# Patient Record
Sex: Female | Born: 2000 | Race: White | Hispanic: No | Marital: Single | Smoking: Never smoker
Health system: Southern US, Community
[De-identification: ages and names within clinical notes are randomized; demographics above are authoritative.]

## PROBLEM LIST (undated history)

## (undated) DIAGNOSIS — K219 Gastro-esophageal reflux disease without esophagitis: Secondary | ICD-10-CM

## (undated) DIAGNOSIS — F809 Developmental disorder of speech and language, unspecified: Secondary | ICD-10-CM

## (undated) DIAGNOSIS — G43909 Migraine, unspecified, not intractable, without status migrainosus: Secondary | ICD-10-CM

## (undated) HISTORY — DX: Migraine, unspecified, not intractable, without status migrainosus: G43.909

## (undated) HISTORY — DX: Developmental disorder of speech and language, unspecified: F80.9

## (undated) HISTORY — DX: Gastro-esophageal reflux disease without esophagitis: K21.9

---
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
## 2021-01-21 ENCOUNTER — Other Ambulatory Visit: Payer: Self-pay

## 2021-01-21 ENCOUNTER — Encounter: Payer: Self-pay | Admitting: Emergency Medicine

## 2021-01-21 ENCOUNTER — Ambulatory Visit
Admission: EM | Admit: 2021-01-21 | Discharge: 2021-01-21 | Disposition: A | Discharge status: Home / Self Care | Payer: Medicaid Other | Attending: Family Medicine | Admitting: Family Medicine

## 2021-01-21 DIAGNOSIS — J209 Acute bronchitis, unspecified: Secondary | ICD-10-CM

## 2021-01-21 DIAGNOSIS — J0140 Acute pansinusitis, unspecified: Secondary | ICD-10-CM

## 2021-01-21 MED ORDER — AMOXICILLIN-POT CLAVULANATE 875-125 MG PO TABS: 1.0000 | ORAL_TABLET | Freq: Two times a day (BID) | ORAL | 0 refills | Status: AC

## 2021-01-21 MED ORDER — BENZONATATE 100 MG PO CAPS: 100.0000 mg | ORAL_CAPSULE | Freq: Three times a day (TID) | ORAL | 0 refills | Status: DC

## 2021-01-21 MED ORDER — PREDNISONE 10 MG (21) PO TBPK: ORAL_TABLET | Freq: Every day | ORAL | 0 refills | Status: AC

## 2021-01-21 NOTE — ED Triage Notes (Signed)
Pt said x 1 week has been having congestion and cough. Pt said hurts to cough in her chest.Pt said has hx of bronchitis and severe allergies.

## 2021-01-21 NOTE — Discharge Instructions (Signed)
I have sent in Augmentin for you to take twice a day for 7 days.  I have sent in tessalon perles for you to use one capsule every 8 hours as needed for cough.  I have sent in a prednisone taper for you to take for 6 days. 6 tablets on day one, 5 tablets on day two, 4 tablets on day three, 3 tablets on day four, 2 tablets on day five, and 1 tablet on day six.  Follow up with this office or with primary care if symptoms are persisting.  Follow up in the ER for high fever, trouble swallowing, trouble breathing, other concerning symptoms.

## 2021-01-25 NOTE — ED Provider Notes (Signed)
[facility]    CSN: [id number] Arrival date & time: 01/21/21  1447      History   Chief Complaint Chief Complaint  Patient presents with  . Cough  . Nasal Congestion    HPI Ruth Snyder is a 20 y.o. female.   Reports nasal congestion and cough for the last 7 days.  States that it hurts to cough at this point.  States that she also has history of bronchitis and severe allergies.  Has has taken OTC cough and cold at home with no relief.  Denies sick contacts.  Has negative history of COVID.  Has completed COVID vaccines.  Has not completed flu vaccine.  Denies headache, shortness of breath, ear pain, nausea, vomiting, diarrhea, rash, fever, other symptoms.  ROS per HPI  The history is provided by the patient.  Cough   Past Medical History:  Diagnosis Date  . GERD (gastroesophageal reflux disease)   . Migraine   . Speech delay     Patient Active Problem List   Diagnosis Date Noted  . Migraine   . Viral syndrome 02/23/2013  . Dermatitis 02/23/2013  . Speech delay     History reviewed. No pertinent surgical history.  OB History   No obstetric history on file.      Home Medications    Prior to Admission medications   Medication Sig Start Date End Date Taking? Authorizing Provider  amoxicillin-clavulanate (AUGMENTIN) 875-125 MG tablet Take 1 tablet by mouth 2 (two) times daily for 7 days. 01/21/21 01/28/21 Yes Moshe Cipro, NP  benzonatate (TESSALON) 100 MG capsule Take 1 capsule (100 mg total) by mouth every 8 (eight) hours. 01/21/21  Yes Moshe Cipro, NP  predniSONE (STERAPRED UNI-PAK 21 TAB) 10 MG (21) TBPK tablet Take by mouth daily for 6 days. Take 6 tablets on day 1, 5 tablets on day 2, 4 tablets on day 3, 3 tablets on day 4, 2 tablets on day 5, 1 tablet on day 6 01/21/21 01/27/21 Yes Moshe Cipro, NP  escitalopram (LEXAPRO) 10 MG tablet TAKE ONE TABLET BY MOUTH ONCE DAILY. 10/03/20   Donita Brooks, MD  escitalopram  (LEXAPRO) 20 MG tablet TAKE ONE TABLET BY MOUTH ONCE DAILY. 02/06/20   Donita Brooks, MD  fluticasone (FLONASE) 50 MCG/ACT nasal spray Place 2 sprays into both nostrils daily. 11/12/20   Bing Neighbors, FNP  levocetirizine (XYZAL) 5 MG tablet Take 1 tablet (5 mg total) by mouth every evening. 11/12/20   Bing Neighbors, FNP  ondansetron (ZOFRAN ODT) 4 MG disintegrating tablet Take 1 tablet (4 mg total) by mouth every 8 (eight) hours as needed for nausea or vomiting. 05/15/20   Lamptey, Britta Mccreedy, MD  pantoprazole (PROTONIX) 20 MG tablet Take 1 tablet (20 mg total) by mouth daily. 05/15/20   Lamptey, Britta Mccreedy, MD    Family History History reviewed. No pertinent family history.  Social History Social History   Tobacco Use  . Smoking status: Passive Smoke Exposure - Never Smoker  . Smokeless tobacco: Never Used  Substance Use Topics  . Alcohol use: No    Alcohol/week: 0.0 standard drinks  . Drug use: No     Allergies   Patient has no known allergies.   Review of Systems Review of Systems  Respiratory: Positive for cough.      Physical Exam Triage Vital Signs ED Triage Vitals [01/21/21 1622]  Enc Vitals Group     BP 121/81     Pulse  Rate 100     Resp 16     Temp 98.2 F (36.8 C)     Temp Source Oral     SpO2 98 %     Weight      Height      Head Circumference      Peak Flow      Pain Score 4     Pain Loc      Pain Edu?      Excl. in GC?    No data found.  Updated Vital Signs BP 121/81 (BP Location: Right Arm)   Pulse 100   Temp 98.2 F (36.8 C) (Oral)   Resp 16   SpO2 98%   Visual Acuity Right Eye Distance:   Left Eye Distance:   Bilateral Distance:    Right Eye Near:   Left Eye Near:    Bilateral Near:     Physical Exam Vitals and nursing note reviewed.  Constitutional:      General: She is not in acute distress.    Appearance: Normal appearance. She is well-developed and normal weight.  HENT:     Head: Normocephalic and atraumatic.      Right Ear: Tympanic membrane, ear canal and external ear normal.     Left Ear: Tympanic membrane, ear canal and external ear normal.     Nose: Congestion present.     Mouth/Throat:     Mouth: Mucous membranes are moist.     Pharynx: Posterior oropharyngeal erythema present.  Eyes:     Extraocular Movements: Extraocular movements intact.     Conjunctiva/sclera: Conjunctivae normal.     Pupils: Pupils are equal, round, and reactive to light.  Cardiovascular:     Rate and Rhythm: Normal rate and regular rhythm.     Heart sounds: Normal heart sounds. No murmur heard.   Pulmonary:     Effort: Pulmonary effort is normal. No respiratory distress.     Breath sounds: No stridor. Wheezing present. No rhonchi or rales.  Chest:     Chest wall: No tenderness.  Musculoskeletal:        General: Normal range of motion.     Cervical back: Normal range of motion and neck supple.  Lymphadenopathy:     Cervical: Cervical adenopathy present.  Skin:    General: Skin is warm and dry.     Capillary Refill: Capillary refill takes less than 2 seconds.  Neurological:     General: No focal deficit present.     Mental Status: She is alert and oriented to person, place, and time.  Psychiatric:        Mood and Affect: Mood normal.        Behavior: Behavior normal.        Thought Content: Thought content normal.      UC Treatments / Results  Labs (all labs ordered are listed, but only abnormal results are displayed) Labs Reviewed - No data to display  EKG   Radiology No results found.  Procedures Procedures (including critical care time)  Medications Ordered in UC Medications - No data to display  Initial Impression / Assessment and Plan / UC Course  I have reviewed the triage vital signs and the nursing notes.  Pertinent labs & imaging results that were available during my care of the patient were reviewed by me and considered in my medical decision making (see chart for details).     Acute pansinusitis Acute bronchitis  Prescribed Augmentin 875 twice daily  x7 days Prescribed Tessalon Perles as needed for cough Prescribe steroid taper COVID and flu testing Follow up with this office or with primary care if symptoms are persisting.  Follow up in the ER for high fever, trouble swallowing, trouble breathing, other concerning symptoms.    Final Clinical Impressions(s) / UC Diagnoses   Final diagnoses:  Acute bronchitis, unspecified organism  Acute non-recurrent pansinusitis     Discharge Instructions     I have sent in Augmentin for you to take twice a day for 7 days.  I have sent in tessalon perles for you to use one capsule every 8 hours as needed for cough.  I have sent in a prednisone taper for you to take for 6 days. 6 tablets on day one, 5 tablets on day two, 4 tablets on day three, 3 tablets on day four, 2 tablets on day five, and 1 tablet on day six.  Follow up with this office or with primary care if symptoms are persisting.  Follow up in the ER for high fever, trouble swallowing, trouble breathing, other concerning symptoms.     ED Prescriptions    Medication Sig Dispense Auth. Provider   amoxicillin-clavulanate (AUGMENTIN) 875-125 MG tablet Take 1 tablet by mouth 2 (two) times daily for 7 days. 14 tablet Moshe Cipro, NP   predniSONE (STERAPRED UNI-PAK 21 TAB) 10 MG (21) TBPK tablet Take by mouth daily for 6 days. Take 6 tablets on day 1, 5 tablets on day 2, 4 tablets on day 3, 3 tablets on day 4, 2 tablets on day 5, 1 tablet on day 6 21 tablet Moshe Cipro, NP   benzonatate (TESSALON) 100 MG capsule Take 1 capsule (100 mg total) by mouth every 8 (eight) hours. 21 capsule Moshe Cipro, NP     PDMP not reviewed this encounter.   Moshe Cipro, NP 01/25/21 1013

## 2021-03-31 DIAGNOSIS — H5213 Myopia, bilateral: Secondary | ICD-10-CM | POA: Diagnosis not present

## 2021-07-15 ENCOUNTER — Encounter: Payer: Self-pay | Admitting: Family Medicine

## 2021-07-15 ENCOUNTER — Ambulatory Visit (INDEPENDENT_AMBULATORY_CARE_PROVIDER_SITE_OTHER): Payer: Medicaid Other | Admitting: Family Medicine

## 2021-07-15 ENCOUNTER — Other Ambulatory Visit: Payer: Self-pay

## 2021-07-15 VITALS — BP 122/78 | HR 16 | Temp 97.4°F | Resp 18 | Ht 62.0 in | Wt 178.0 lb

## 2021-07-15 DIAGNOSIS — J069 Acute upper respiratory infection, unspecified: Secondary | ICD-10-CM

## 2021-07-15 MED ORDER — OSELTAMIVIR PHOSPHATE 75 MG PO CAPS: 75.0000 mg | ORAL_CAPSULE | Freq: Two times a day (BID) | ORAL | 0 refills | Status: DC

## 2021-07-15 NOTE — Progress Notes (Signed)
Subjective:    Patient ID: Ruth Snyder, female    DOB: 2000/12/16, 20 y.o.   MRN: [id number]  HPI Patient states that everyone in her house is sick.  This includes her cousin who was recently diagnosed with the flu although the cousin has gone back to school today.  Symptoms for the patient began on Thursday.  Symptoms include fever, body aches, head congestion, rhinorrhea, and cough.  Today she is afebrile.  Her lungs are clear to auscultation bilaterally.  Her exam today is reassuring Past Medical History:  Diagnosis Date   GERD (gastroesophageal reflux disease)    Migraine    Speech delay    History reviewed. No pertinent surgical history. Current Outpatient Medications on File Prior to Visit  Medication Sig Dispense Refill   escitalopram (LEXAPRO) 20 MG tablet TAKE ONE TABLET BY MOUTH ONCE DAILY. (Patient not taking: Reported on 07/15/2021) 30 tablet 0   fluticasone (FLONASE) 50 MCG/ACT nasal spray Place 2 sprays into both nostrils daily. (Patient not taking: Reported on 07/15/2021) 16 g 0   levocetirizine (XYZAL) 5 MG tablet Take 1 tablet (5 mg total) by mouth every evening. (Patient not taking: Reported on 07/15/2021) 30 tablet 0   ondansetron (ZOFRAN ODT) 4 MG disintegrating tablet Take 1 tablet (4 mg total) by mouth every 8 (eight) hours as needed for nausea or vomiting. (Patient not taking: Reported on 07/15/2021) 10 tablet 0   pantoprazole (PROTONIX) 20 MG tablet Take 1 tablet (20 mg total) by mouth daily. (Patient not taking: Reported on 07/15/2021) 30 tablet 0   No current facility-administered medications on file prior to visit.   No Known Allergies  Social History   Socioeconomic History   Marital status: Single    Spouse name: Not on file   Number of children: Not on file   Years of education: Not on file   Highest education level: Not on file  Occupational History   Not on file  Tobacco Use   Smoking status: Passive Smoke Exposure - Never Smoker    Smokeless tobacco: Never  Substance and Sexual Activity   Alcohol use: No    Alcohol/week: 0.0 standard drinks   Drug use: No   Sexual activity: Not on file  Other Topics Concern   Not on file  Social History Narrative   ** Merged History Encounter **       Social Determinants of Health   Financial Resource Strain: Not on file  Food Insecurity: Not on file  Transportation Needs: Not on file  Physical Activity: Not on file  Stress: Not on file  Social Connections: Not on file  Intimate Partner Violence: Not on file      Review of Systems  All other systems reviewed and are negative.     Objective:   Physical Exam Constitutional:      Appearance: Normal appearance. She is not ill-appearing or toxic-appearing.  HENT:     Right Ear: Tympanic membrane and ear canal normal.     Left Ear: Tympanic membrane and ear canal normal.     Nose: Congestion and rhinorrhea present.     Mouth/Throat:     Pharynx: No oropharyngeal exudate or posterior oropharyngeal erythema.  Eyes:     Conjunctiva/sclera: Conjunctivae normal.  Cardiovascular:     Rate and Rhythm: Normal rate and regular rhythm.     Heart sounds: Normal heart sounds. No murmur heard. Pulmonary:     Effort: Pulmonary effort is normal. No respiratory distress.  Breath sounds: Normal breath sounds. No wheezing, rhonchi or rales.  Musculoskeletal:     Cervical back: Neck supple.  Lymphadenopathy:     Cervical: No cervical adenopathy.  Neurological:     Mental Status: She is alert.          Assessment & Plan:  Viral URI - Plan: Influenza a and b Given the symptoms, the patient sounds like she has a viral upper respiratory infection.  Given the exposure to flu I will screen her for the flu.  Unfortunately we are out of rapid flu test.  I will also screen the patient for COVID.  Test will be back in 48 hours so I will cover the patient with Tamiflu empirically given her exposure

## 2021-07-16 ENCOUNTER — Telehealth: Payer: Self-pay | Admitting: *Deleted

## 2021-07-16 LAB — SARS-COVID-2 RNA(COVID19)AND INFLUENZA A&B, QUALITATIVE NAAT
FLU A: NOT DETECTED
FLU B: NOT DETECTED
SARS CoV2 RNA: NOT DETECTED

## 2021-07-16 NOTE — Telephone Encounter (Signed)
Received call from patient grandmother.   Reports that patient was seen on 07/15/2021 for viral URI. States that she did not feel well enough to go to work last night.    Requested excuse for 07/15/2021.  Patient works at [employer], [employer].  Note transcribed.

## 2021-11-04 ENCOUNTER — Ambulatory Visit
Admission: EM | Admit: 2021-11-04 | Discharge: 2021-11-04 | Disposition: A | Discharge status: Home / Self Care | Payer: Medicaid Other | Attending: Family Medicine | Admitting: Family Medicine

## 2021-11-04 ENCOUNTER — Other Ambulatory Visit: Payer: Self-pay

## 2021-11-04 DIAGNOSIS — L02419 Cutaneous abscess of limb, unspecified: Secondary | ICD-10-CM

## 2021-11-04 MED ORDER — CEPHALEXIN 500 MG PO CAPS: 500.0000 mg | ORAL_CAPSULE | Freq: Two times a day (BID) | ORAL | 0 refills | Status: DC

## 2021-11-04 MED ORDER — CHLORHEXIDINE GLUCONATE 4 % EX LIQD: Freq: Every day | CUTANEOUS | 0 refills | Status: DC | PRN

## 2021-11-04 NOTE — ED Provider Notes (Signed)
?[facility] ? ? ? ?CSN: [id number] ?Arrival date & time: 11/04/21  1215 ? ? ?  ? ?History   ?Chief Complaint ?Chief Complaint  ?Patient presents with  ? Abscess  ? ? ?HPI ?Ruth Snyder is a 21 y.o. female.  ? ?Presenting today with a red painful bump under her right underarm that she states has been present for months but larger, more red and swollen the past few days.  She has tried warm compresses with no relief of symptoms.  Denies fever, drainage, bleeding, injury to the area. ? ? ?Past Medical History:  ?Diagnosis Date  ? GERD (gastroesophageal reflux disease)   ? Migraine   ? Speech delay   ? ? ?Patient Active Problem List  ? Diagnosis Date Noted  ? Migraine   ? Viral syndrome 02/23/2013  ? Dermatitis 02/23/2013  ? Speech delay   ? ? ?History reviewed. No pertinent surgical history. ? ?OB History   ?No obstetric history on file. ?  ? ? ? ?Home Medications   ? ?Prior to Admission medications   ?Medication Sig Start Date End Date Taking? Authorizing Provider  ?cephALEXin (KEFLEX) 500 MG capsule Take 1 capsule (500 mg total) by mouth 2 (two) times daily. 11/04/21  Yes Particia Nearing, PA-C  ?chlorhexidine (HIBICLENS) 4 % external liquid Apply topically daily as needed. 11/04/21  Yes Particia Nearing, PA-C  ?escitalopram (LEXAPRO) 20 MG tablet TAKE ONE TABLET BY MOUTH ONCE DAILY. ?Patient not taking: Reported on 07/15/2021 02/06/20   Donita Brooks, MD  ?fluticasone [facility]) 50 MCG/ACT nasal spray Place 2 sprays into both nostrils daily. ?Patient not taking: Reported on 07/15/2021 11/12/20   Bing Neighbors, FNP  ?levocetirizine (XYZAL) 5 MG tablet Take 1 tablet (5 mg total) by mouth every evening. ?Patient not taking: Reported on 07/15/2021 11/12/20   Bing Neighbors, FNP  ?ondansetron (ZOFRAN ODT) 4 MG disintegrating tablet Take 1 tablet (4 mg total) by mouth every 8 (eight) hours as needed for nausea or vomiting. ?Patient not taking: Reported on 07/15/2021 05/15/20   Merrilee Jansky, MD  ?oseltamivir (TAMIFLU) 75 MG capsule Take 1 capsule (75 mg total) by mouth 2 (two) times daily. 07/15/21   Donita Brooks, MD  ?pantoprazole (PROTONIX) 20 MG tablet Take 1 tablet (20 mg total) by mouth daily. ?Patient not taking: Reported on 07/15/2021 05/15/20   Merrilee Jansky, MD  ? ? ?Family History ?History reviewed. No pertinent family history. ? ?Social History ?Social History  ? ?Tobacco Use  ? Smoking status: Passive Smoke Exposure - Never Smoker  ? Smokeless tobacco: Never  ?Substance Use Topics  ? Alcohol use: No  ?  Alcohol/week: 0.0 standard drinks  ? Drug use: No  ? ? ? ?Allergies   ?Patient has no known allergies. ? ? ?Review of Systems ?Review of Systems ?Per HPI ? ?Physical Exam ?Triage Vital Signs ?ED Triage Vitals  ?Enc Vitals Group  ?   BP 11/04/21 1414 133/81  ?   Pulse Rate 11/04/21 1414 85  ?   Resp 11/04/21 1414 18  ?   Temp 11/04/21 1414 98.3 ?F (36.8 ?C)  ?   Temp src --   ?   SpO2 11/04/21 1414 99 %  ?   Weight --   ?   Height --   ?   Head Circumference --   ?   Peak Flow --   ?   Pain Score 11/04/21 1412 5  ?  Pain Loc --   ?   Pain Edu? --   ?   Excl. in GC? --   ? ?No data found. ? ?Updated Vital Signs ?BP 133/81   Pulse 85   Temp 98.3 ?F (36.8 ?C)   Resp 18   SpO2 99%  ? ?Visual Acuity ?Right Eye Distance:   ?Left Eye Distance:   ?Bilateral Distance:   ? ?Right Eye Near:   ?Left Eye Near:    ?Bilateral Near:    ? ?Physical Exam ?Vitals and nursing note reviewed.  ?Constitutional:   ?   Appearance: Normal appearance. She is not ill-appearing.  ?HENT:  ?   Head: Atraumatic.  ?Eyes:  ?   Extraocular Movements: Extraocular movements intact.  ?   Conjunctiva/sclera: Conjunctivae normal.  ?Cardiovascular:  ?   Rate and Rhythm: Normal rate and regular rhythm.  ?   Heart sounds: Normal heart sounds.  ?Pulmonary:  ?   Effort: Pulmonary effort is normal.  ?   Breath sounds: Normal breath sounds.  ?Musculoskeletal:     ?   General: Normal range of motion.  ?   Cervical back:  Normal range of motion and neck supple.  ?Skin: ?   General: Skin is warm and dry.  ?   Comments: Erythematous Semi fluctuant tender to palpation mass right axilla.  No induration, drainage, bleeding.  Sub-1 cm in diameter  ?Neurological:  ?   Mental Status: She is alert and oriented to person, place, and time.  ?Psychiatric:     ?   Mood and Affect: Mood normal.     ?   Thought Content: Thought content normal.     ?   Judgment: Judgment normal.  ? ? ? ?UC Treatments / Results  ?Labs ?(all labs ordered are listed, but only abnormal results are displayed) ?Labs Reviewed - No data to display ? ?EKG ? ? ?Radiology ?No results found. ? ?Procedures ?Procedures (including critical care time) ? ?Medications Ordered in UC ?Medications - No data to display ? ?Initial Impression / Assessment and Plan / UC Course  ?I have reviewed the triage vital signs and the nursing notes. ? ?Pertinent labs & imaging results that were available during my care of the patient were reviewed by me and considered in my medical decision making (see chart for details). ? ?  ? ?Early infection of chronic cyst.  Treat with Keflex, Hibiclens, warm compresses and follow-up with PCP for recheck. ? ?Final Clinical Impressions(s) / UC Diagnoses  ? ?Final diagnoses:  ?Axillary abscess  ? ?Discharge Instructions   ?None ?  ? ?ED Prescriptions   ? ? Medication Sig Dispense Auth. Provider  ? cephALEXin (KEFLEX) 500 MG capsule Take 1 capsule (500 mg total) by mouth 2 (two) times daily. 14 capsule Particia Nearing, [place]  ? chlorhexidine (HIBICLENS) 4 % external liquid Apply topically daily as needed. 120 mL Particia Nearing, [place]  ? ?  ? ?PDMP not reviewed this encounter. ?  ?Particia Nearing, PA-C ?11/04/21 1448 ? ?

## 2021-11-04 NOTE — ED Triage Notes (Signed)
Pt presents with possible abscess under right arm. Small red bump that she states that its been there for a while  ?

## 2021-11-19 ENCOUNTER — Other Ambulatory Visit: Payer: Self-pay

## 2021-11-19 ENCOUNTER — Ambulatory Visit
Admission: EM | Admit: 2021-11-19 | Discharge: 2021-11-19 | Disposition: A | Discharge status: Home / Self Care | Payer: Medicaid Other | Attending: Family Medicine | Admitting: Family Medicine

## 2021-11-19 DIAGNOSIS — J029 Acute pharyngitis, unspecified: Secondary | ICD-10-CM | POA: Insufficient documentation

## 2021-11-19 DIAGNOSIS — H1033 Unspecified acute conjunctivitis, bilateral: Secondary | ICD-10-CM | POA: Insufficient documentation

## 2021-11-19 LAB — POCT RAPID STREP A (OFFICE): Rapid Strep A Screen: NEGATIVE

## 2021-11-19 MED ORDER — TOBRAMYCIN 0.3 % OP SOLN: 1.0000 [drp] | Freq: Four times a day (QID) | OPHTHALMIC | 0 refills | Status: DC

## 2021-11-19 NOTE — Discharge Instructions (Signed)
You may use over the counter ibuprofen or acetaminophen as needed.  For a sore throat, over the counter products such as [employer] Peroxyl Mouth Sore Rinse or Chloraseptic Sore Throat Spray may provide some temporary relief. Your rapid strep test was negative today. We have sent your throat swab for culture and will let you know of any positive results. 

## 2021-11-19 NOTE — ED Triage Notes (Signed)
Pt states  her throat has been hurting for a few days ? ?Pt states her throat hurts worse at night.l She states she has tried some hot tea and honey ? ?Pt states her left eye is running and her throat is sore as well  ? ?Pt states she use a hot compress on her left eye this morning and mucus came out ? ?Denies Fever ?

## 2021-11-20 NOTE — ED Provider Notes (Signed)
?  [facility] ? ? ?[id number] ?11/19/21 Arrival Time: [id number] ? ?ASSESSMENT & PLAN: ? ?1. Sore throat   ?2. Acute conjunctivitis of both eyes, unspecified acute conjunctivitis type   ? ?Discussed typical duration of viral illnesses. ?Rapid strep negative. Throat culture sent. ?OTC symptom care as needed. ? ?Discharge Medication List as of 11/19/2021 10:59 AM  ?  ? ?START taking these medications  ? Details  ?tobramycin (TOBREX) 0.3 % ophthalmic solution Place 1 drop into both eyes every 6 (six) hours. Use for 5-7 days., Starting Tue 11/19/2021, Normal  ?  ?  ? ? ? Follow-up Information   ? ? Donita Brooks, MD.   ?Specialty: Family Medicine ?Why: If worsening or failing to improve as anticipated. ?Contact information: ?[address] [place] [address] ?[phone] ? ? ?  ?  ? ?  ?  ? ?  ? ? ?Reviewed expectations re: course of current medical issues. Questions answered. ?Outlined signs and symptoms indicating need for more acute intervention. ?Understanding verbalized. ?After Visit Summary given. ? ? ?SUBJECTIVE: ?History from: Patient. ?Ruth Snyder is a 21 y.o. female. Reports: ST; few days; worse at night. Now L eye is irritated with watery drainage. Denies: fever, cough, and difficulty breathing. Normal PO intake without n/v/d. ? ?OBJECTIVE: ? ?Vitals:  ? 11/19/21 1032  ?BP: (!) 145/74  ?Pulse: 91  ?Resp: 20  ?Temp: 97.9 ?F (36.6 ?C)  ?TempSrc: Oral  ?SpO2: 98%  ?  ?General appearance: alert; no distress ?Eyes: PERRLA; EOMI; conjunctiva OS with mild irritation and watery drainage ?HENT: [place]; AT; with mild nasal congestion ?Neck: supple  ?Lungs: speaks full sentences without difficulty; unlabored ?Extremities: no edema ?Skin: warm and dry ?Neurologic: normal gait ?Psychological: alert and cooperative; normal mood and affect ? ?Labs: ?Results for orders placed or performed during the hospital encounter of 11/19/21  ?POCT rapid strep A  ?Result Value Ref Range  ? Rapid Strep A Screen Negative  Negative  ? ?Labs Reviewed  ?CULTURE, GROUP A STREP [facility])  ?POCT RAPID STREP A (OFFICE)  ? ? ? ?No Known Allergies ? ?Past Medical History:  ?Diagnosis Date  ? GERD (gastroesophageal reflux disease)   ? Migraine   ? Speech delay   ? ?Social History  ? ?Socioeconomic History  ? Marital status: Single  ?  Spouse name: Not on file  ? Number of children: Not on file  ? Years of education: Not on file  ? Highest education level: Not on file  ?Occupational History  ? Not on file  ?Tobacco Use  ? Smoking status: Never  ?  Passive exposure: Yes  ? Smokeless tobacco: Never  ?Vaping Use  ? Vaping Use: Never used  ?Substance and Sexual Activity  ? Alcohol use: No  ?  Alcohol/week: 0.0 standard drinks  ? Drug use: No  ? Sexual activity: Never  ?  Birth control/protection: None  ?Other Topics Concern  ? Not on file  ?Social History Narrative  ? ** Merged History Encounter **  ?    ? ?Social Determinants of Health  ? ?Financial Resource Strain: Not on file  ?Food Insecurity: Not on file  ?Transportation Needs: Not on file  ?Physical Activity: Not on file  ?Stress: Not on file  ?Social Connections: Not on file  ?Intimate Partner Violence: Not on file  ? ?No family history on file. ?History reviewed. No pertinent surgical history. ?  Mardella Layman, MD ?11/20/21 [phone] ? ?

## 2021-11-22 LAB — CULTURE, GROUP A STREP (THRC)

## 2021-12-19 ENCOUNTER — Encounter: Payer: Self-pay | Admitting: Emergency Medicine

## 2021-12-19 ENCOUNTER — Ambulatory Visit (INDEPENDENT_AMBULATORY_CARE_PROVIDER_SITE_OTHER): Payer: Medicaid Other

## 2021-12-19 ENCOUNTER — Ambulatory Visit
Admission: EM | Admit: 2021-12-19 | Discharge: 2021-12-19 | Disposition: A | Discharge status: Home / Self Care | Payer: Medicaid Other | Attending: Urgent Care | Admitting: Urgent Care

## 2021-12-19 DIAGNOSIS — M25562 Pain in left knee: Secondary | ICD-10-CM

## 2021-12-19 DIAGNOSIS — S8392XA Sprain of unspecified site of left knee, initial encounter: Secondary | ICD-10-CM

## 2021-12-19 DIAGNOSIS — M25561 Pain in right knee: Secondary | ICD-10-CM

## 2021-12-19 MED ORDER — NAPROXEN 500 MG PO TABS: 500.0000 mg | ORAL_TABLET | Freq: Two times a day (BID) | ORAL | 0 refills | Status: DC

## 2021-12-19 NOTE — ED Provider Notes (Signed)
?[facility] ? ? ?MRN: [id number] DOB: 21-Dec-2000 ? ?Subjective:  ? ?Ruth Snyder is a 21 y.o. female presenting for 2-day history of acute onset left knee pain with swelling.  Patient was at work when she suffered the injury.  States that she was standing and ended up twisting her knee awkwardly as she turned.  This caused pain and when she stood upright felt like her knee popped in 3 places.  She has since had difficulty bearing weight, walking.  Has not used medications for pain relief.  Feels like the knee is numb and tingling as well. ? ?No current facility-administered medications for this encounter. ? ?Current Outpatient Medications:  ?  escitalopram (LEXAPRO) 20 MG tablet, TAKE ONE TABLET BY MOUTH ONCE DAILY. (Patient not taking: Reported on 07/15/2021), Disp: 30 tablet, Rfl: 0 ?  fluticasone (FLONASE) 50 MCG/ACT nasal spray, Place 2 sprays into both nostrils daily. (Patient not taking: Reported on 07/15/2021), Disp: 16 g, Rfl: 0 ?  levocetirizine (XYZAL) 5 MG tablet, Take 1 tablet (5 mg total) by mouth every evening. (Patient not taking: Reported on 07/15/2021), Disp: 30 tablet, Rfl: 0 ?  ondansetron (ZOFRAN ODT) 4 MG disintegrating tablet, Take 1 tablet (4 mg total) by mouth every 8 (eight) hours as needed for nausea or vomiting. (Patient not taking: Reported on 07/15/2021), Disp: 10 tablet, Rfl: 0 ?  oseltamivir (TAMIFLU) 75 MG capsule, Take 1 capsule (75 mg total) by mouth 2 (two) times daily., Disp: 10 capsule, Rfl: 0 ?  pantoprazole (PROTONIX) 20 MG tablet, Take 1 tablet (20 mg total) by mouth daily. (Patient not taking: Reported on 07/15/2021), Disp: 30 tablet, Rfl: 0 ?  tobramycin (TOBREX) 0.3 % ophthalmic solution, Place 1 drop into both eyes every 6 (six) hours. Use for 5-7 days., Disp: 5 mL, Rfl: 0  ? ?No Known Allergies ? ?Past Medical History:  ?Diagnosis Date  ? GERD (gastroesophageal reflux disease)   ? Migraine   ? Speech delay   ?  ? ?History reviewed. No pertinent  surgical history. ? ?History reviewed. No pertinent family history. ? ?Social History  ? ?Tobacco Use  ? Smoking status: Never  ?  Passive exposure: Yes  ? Smokeless tobacco: Never  ?Vaping Use  ? Vaping Use: Never used  ?Substance Use Topics  ? Alcohol use: No  ?  Alcohol/week: 0.0 standard drinks  ? Drug use: No  ? ? ?ROS ? ? ?Objective:  ? ?Vitals: ?BP 135/84 (BP Location: Right Arm)   Pulse 85   Temp 98.4 ?F (36.9 ?C) (Oral)   Resp 18   LMP 11/17/2021 (Exact Date)   SpO2 99%  ? ?Physical Exam ?Constitutional:   ?   General: She is not in acute distress. ?   Appearance: Normal appearance. She is well-developed. She is not ill-appearing, toxic-appearing or diaphoretic.  ?HENT:  ?   Head: Normocephalic and atraumatic.  ?   Nose: Nose normal.  ?   Mouth/Throat:  ?   Mouth: Mucous membranes are moist.  ?Eyes:  ?   General: No scleral icterus.    ?   Right eye: No discharge.     ?   Left eye: No discharge.  ?   Extraocular Movements: Extraocular movements intact.  ?Cardiovascular:  ?   Rate and Rhythm: Normal rate.  ?Pulmonary:  ?   Effort: Pulmonary effort is normal.  ?Musculoskeletal:  ?   Left knee: Bony tenderness present. No swelling, deformity, effusion, erythema, ecchymosis, lacerations or crepitus. Decreased  range of motion. Tenderness present over the medial joint line, lateral joint line and patellar tendon. Normal alignment and normal patellar mobility.  ?   Comments: Significant guarding of the left knee.  ?Skin: ?   General: Skin is warm and dry.  ?Neurological:  ?   General: No focal deficit present.  ?   Mental Status: She is alert and oriented to person, place, and time.  ?Psychiatric:     ?   Mood and Affect: Mood normal.     ?   Behavior: Behavior normal.  ? ? ?DG Knee Complete 4 Views Left ? ?Result Date: 12/19/2021 ?CLINICAL DATA:  Twisted left knee and felt a pop 3 times. Injury was 3 days ago. EXAM: LEFT KNEE - COMPLETE 4+ VIEW COMPARISON:  None. FINDINGS: Normal bone mineralization. Joint  spaces are preserved. No joint effusion. No acute fracture or dislocation. IMPRESSION: No acute abnormality. Electronically Signed   By: Neita Garnet M.D.   On: 12/19/2021 10:25   ? ?A 4 inch Ace wrap was applied to the left knee. ? ?Assessment and Plan :  ? ?PDMP not reviewed this encounter. ? ?1. Sprain of left knee, unspecified ligament, initial encounter   ?2. Acute pain of right knee   ? ? Will manage for knee sprain with rice method, NSAID.  Patient has an orthopedist in [place], [place] that she follows up with.  Recommended contacting their office today.  Counseled patient on potential for adverse effects with medications prescribed/recommended today, ER and return-to-clinic precautions discussed, patient verbalized understanding. ? ?  ?Wallis Bamberg, PA-C ?12/19/21 1127 ? ?

## 2021-12-19 NOTE — ED Triage Notes (Signed)
Left knee pain, fell 2 days ago and twisted knee.  States knee popped 3 times.  States leg fells numb and tingly  ?

## 2021-12-23 ENCOUNTER — Encounter: Payer: Self-pay | Admitting: Family Medicine

## 2021-12-23 ENCOUNTER — Ambulatory Visit (INDEPENDENT_AMBULATORY_CARE_PROVIDER_SITE_OTHER): Payer: Medicaid Other | Admitting: Family Medicine

## 2021-12-23 VITALS — BP 120/90 | HR 93 | Temp 97.7°F | Ht 62.0 in | Wt 181.1 lb

## 2021-12-23 DIAGNOSIS — M25562 Pain in left knee: Secondary | ICD-10-CM

## 2021-12-23 MED ORDER — DICLOFENAC SODIUM 75 MG PO TBEC: 75.0000 mg | DELAYED_RELEASE_TABLET | Freq: Two times a day (BID) | ORAL | 0 refills | Status: DC

## 2021-12-23 NOTE — Progress Notes (Signed)
? ?Subjective:  ? ? Patient ID: Ruth Snyder, female    DOB: [birth year], 21 y.o.   MRN: [id number] ? ?HPI ?Patient states that she was at work last week when she fell and twisted her knee as she fell.  She reports severe pain in her knee over the medial and lateral joint line anteriorly.  There is no significant effusion today on exam.  There is no erythema or bruising.  There is no swelling.  However there is tenderness to palpation over the anterior medial and lateral joint lines.  Last week she went to an urgent care where they took an x-ray that was negative.  They gave her a knee brace due to concern about possible torn ligament.  On examination today there is no laxity to varus or valgus stress suggesting that the MCL and LCL were not injured.  She has negative anterior and posterior drawer sign.  She does have pain generally all over the knee with Apley grind but it does not localize to the posterior border of the knee to suggest a meniscal tear.  She states she is taking a medication that starts with a P for pain.  I am assuming this is Percocet.  She denies taking any NSAID. ?Past Medical History:  ?Diagnosis Date  ? GERD (gastroesophageal reflux disease)   ? Migraine   ? Speech delay   ? ?No past surgical history on file. ?No current outpatient medications on file prior to visit.  ? ?No current facility-administered medications on file prior to visit.  ? ?No Known Allergies ? ?Social History  ? ?Socioeconomic History  ? Marital status: Single  ?  Spouse name: Not on file  ? Number of children: Not on file  ? Years of education: Not on file  ? Highest education level: Not on file  ?Occupational History  ? Not on file  ?Tobacco Use  ? Smoking status: Never  ?  Passive exposure: Yes  ? Smokeless tobacco: Never  ?Vaping Use  ? Vaping Use: Never used  ?Substance and Sexual Activity  ? Alcohol use: No  ?  Alcohol/week: 0.0 standard drinks  ? Drug use: No  ? Sexual activity: Never  ?  Birth  control/protection: None  ?Other Topics Concern  ? Not on file  ?Social History Narrative  ? ** Merged History Encounter **  ?    ? ?Social Determinants of Health  ? ?Financial Resource Strain: Not on file  ?Food Insecurity: Not on file  ?Transportation Needs: Not on file  ?Physical Activity: Not on file  ?Stress: Not on file  ?Social Connections: Not on file  ?Intimate Partner Violence: Not on file  ? ? ? ? ?Review of Systems  ?All other systems reviewed and are negative. ? ?   ?Objective:  ? Physical Exam ?Constitutional:   ?   Appearance: Normal appearance. She is not ill-appearing or toxic-appearing.  ?Cardiovascular:  ?   Rate and Rhythm: Normal rate and regular rhythm.  ?   Heart sounds: Normal heart sounds. No murmur heard. ?Pulmonary:  ?   Effort: Pulmonary effort is normal. No respiratory distress.  ?   Breath sounds: Normal breath sounds. No wheezing, rhonchi or rales.  ?Musculoskeletal:  ?   Left knee: No swelling, deformity, effusion, erythema or ecchymosis. Decreased range of motion. Tenderness present over the medial joint line and lateral joint line. No LCL laxity, MCL laxity, ACL laxity or PCL laxity.Normal meniscus and normal patellar mobility.  ?Neurological:  ?  Mental Status: She is alert.  ? ? ? ? ? ?   ?Assessment & Plan:  ?Acute pain of left knee ?There is no significant instability today on the exam.  Therefore I do not feel that the patient has likely ruptured an MCL, LCL, ACL, or PCL.  I cannot rule out a meniscal tear given the diffuse nature of the pain.  Range of motion.  X-rays rule out fracture.  Patient has an appointment tomorrow to see an orthopedist.  The only thing I would add is to start an NSAID diclofenac 75 mg twice a day.  I would like the patient to call me back with her medicine first to make sure what she is taking.  If she is not on an NSAID I would recommend taking diclofenac as I believe that this would help with the pain and inflammation. ?

## 2021-12-24 DIAGNOSIS — S8992XA Unspecified injury of left lower leg, initial encounter: Secondary | ICD-10-CM | POA: Diagnosis not present

## 2021-12-24 DIAGNOSIS — S83422A Sprain of lateral collateral ligament of left knee, initial encounter: Secondary | ICD-10-CM | POA: Diagnosis not present

## 2021-12-24 DIAGNOSIS — M25562 Pain in left knee: Secondary | ICD-10-CM | POA: Diagnosis not present

## 2021-12-24 DIAGNOSIS — M25462 Effusion, left knee: Secondary | ICD-10-CM | POA: Diagnosis not present

## 2021-12-26 ENCOUNTER — Other Ambulatory Visit: Payer: Self-pay | Admitting: Family Medicine

## 2021-12-26 MED ORDER — MELOXICAM 15 MG PO TABS: 15.0000 mg | ORAL_TABLET | Freq: Every day | ORAL | 0 refills | Status: AC

## 2022-01-01 DIAGNOSIS — M25462 Effusion, left knee: Secondary | ICD-10-CM | POA: Diagnosis not present

## 2022-01-01 DIAGNOSIS — M25562 Pain in left knee: Secondary | ICD-10-CM | POA: Diagnosis not present

## 2022-01-01 DIAGNOSIS — S8992XD Unspecified injury of left lower leg, subsequent encounter: Secondary | ICD-10-CM | POA: Diagnosis not present

## 2022-01-06 DIAGNOSIS — M25462 Effusion, left knee: Secondary | ICD-10-CM | POA: Diagnosis not present

## 2022-01-06 DIAGNOSIS — S8992XD Unspecified injury of left lower leg, subsequent encounter: Secondary | ICD-10-CM | POA: Diagnosis not present

## 2022-01-06 DIAGNOSIS — M25562 Pain in left knee: Secondary | ICD-10-CM | POA: Diagnosis not present

## 2022-03-19 ENCOUNTER — Ambulatory Visit
Admission: EM | Admit: 2022-03-19 | Discharge: 2022-03-19 | Disposition: A | Discharge status: Home / Self Care | Payer: Medicaid Other | Attending: Family Medicine | Admitting: Family Medicine

## 2022-03-19 ENCOUNTER — Encounter: Payer: Self-pay | Admitting: Emergency Medicine

## 2022-03-19 DIAGNOSIS — R197 Diarrhea, unspecified: Secondary | ICD-10-CM

## 2022-03-19 DIAGNOSIS — R112 Nausea with vomiting, unspecified: Secondary | ICD-10-CM | POA: Diagnosis not present

## 2022-03-19 MED ORDER — ONDANSETRON 4 MG PO TBDP: 4.0000 mg | ORAL_TABLET | Freq: Three times a day (TID) | ORAL | 0 refills | Status: DC | PRN

## 2022-03-19 MED ORDER — ONDANSETRON 4 MG PO TBDP
4.0000 mg | ORAL_TABLET | Freq: Once | ORAL | Status: AC
  Administered 2022-03-19: 4 mg via ORAL

## 2022-03-19 NOTE — ED Triage Notes (Signed)
Headache, vomiting and diarrhea since Monday.

## 2022-03-19 NOTE — ED Provider Notes (Signed)
[facility]    CSN: [id number] Arrival date & time: 03/19/22  3785      History   Chief Complaint No chief complaint on file.   HPI Ruth Snyder is a 21 y.o. female.   Presenting today with 2-day history of nausea, vomiting, diarrhea, headache, fatigue.  Denies cough, congestion, chest pain, shortness of breath, abdominal pain, fever, rashes.  So far not trying anything over-the-counter for symptoms.  Tolerating small amounts of p.o. well.  Family member sick with similar symptoms.    Past Medical History:  Diagnosis Date   GERD (gastroesophageal reflux disease)    Migraine    Speech delay     Patient Active Problem List   Diagnosis Date Noted   Migraine    Viral syndrome 02/23/2013   Dermatitis 02/23/2013   Speech delay     History reviewed. No pertinent surgical history.  OB History   No obstetric history on file.      Home Medications    Prior to Admission medications   Medication Sig Start Date End Date Taking? Authorizing Provider  meloxicam (MOBIC) 15 MG tablet Take 1 tablet (15 mg total) by mouth daily. 12/26/21   Donita Brooks, MD  ondansetron (ZOFRAN-ODT) 4 MG disintegrating tablet Take 1 tablet (4 mg total) by mouth every 8 (eight) hours as needed for nausea or vomiting. 03/19/22  Yes Particia Nearing, PA-C    Family History History reviewed. No pertinent family history.  Social History Social History   Tobacco Use   Smoking status: Never    Passive exposure: Yes   Smokeless tobacco: Never  Vaping Use   Vaping Use: Never used  Substance Use Topics   Alcohol use: No    Alcohol/week: 0.0 standard drinks of alcohol   Drug use: No     Allergies   Patient has no known allergies.   Review of Systems Review of Systems Per HPI  Physical Exam Triage Vital Signs ED Triage Vitals  Enc Vitals Group     BP 03/19/22 1858 108/72     Pulse Rate 03/19/22 1858 78     Resp 03/19/22 1858 18     Temp 03/19/22 1858  98.2 F (36.8 C)     Temp Source 03/19/22 1858 Oral     SpO2 03/19/22 1858 96 %     Weight --      Height --      Head Circumference --      Peak Flow --      Pain Score 03/19/22 1859 5     Pain Loc --      Pain Edu? --      Excl. in GC? --    No data found.  Updated Vital Signs BP 108/72 (BP Location: Right Arm)   Pulse 78   Temp 98.2 F (36.8 C) (Oral)   Resp 18   LMP 02/20/2022 (Approximate)   SpO2 96%   Visual Acuity Right Eye Distance:   Left Eye Distance:   Bilateral Distance:    Right Eye Near:   Left Eye Near:    Bilateral Near:     Physical Exam Vitals and nursing note reviewed.  Constitutional:      Appearance: Normal appearance. She is not ill-appearing.  HENT:     Head: Atraumatic.     Nose: Nose normal.     Mouth/Throat:     Mouth: Mucous membranes are moist.  Eyes:     Extraocular Movements: Extraocular  movements intact.     Conjunctiva/sclera: Conjunctivae normal.  Cardiovascular:     Rate and Rhythm: Normal rate and regular rhythm.     Heart sounds: Normal heart sounds.  Pulmonary:     Effort: Pulmonary effort is normal.     Breath sounds: Normal breath sounds.  Abdominal:     General: Abdomen is flat. Bowel sounds are normal. There is no distension.     Palpations: Abdomen is soft.     Tenderness: There is no abdominal tenderness. There is no guarding.  Musculoskeletal:        General: Normal range of motion.     Cervical back: Normal range of motion and neck supple.  Skin:    General: Skin is warm and dry.  Neurological:     Mental Status: She is alert and oriented to person, place, and time.     Motor: No weakness.     Gait: Gait normal.  Psychiatric:        Mood and Affect: Mood normal.        Thought Content: Thought content normal.        Judgment: Judgment normal.      UC Treatments / Results  Labs (all labs ordered are listed, but only abnormal results are displayed) Labs Reviewed - No data to  display  EKG   Radiology No results found.  Procedures Procedures (including critical care time)  Medications Ordered in UC Medications  ondansetron (ZOFRAN-ODT) disintegrating tablet 4 mg (4 mg Oral Given 03/19/22 1925)    Initial Impression / Assessment and Plan / UC Course  I have reviewed the triage vital signs and the nursing notes.  Pertinent labs & imaging results that were available during my care of the patient were reviewed by me and considered in my medical decision making (see chart for details).     Zofran administered in clinic for active nausea, declines viral testing today.  Treat with Zofran, brat diet, fluids, rest.  Work note given.  Return for worsening symptoms.  Final Clinical Impressions(s) / UC Diagnoses   Final diagnoses:  Nausea vomiting and diarrhea   Discharge Instructions   None    ED Prescriptions     Medication Sig Dispense Auth. Provider   ondansetron (ZOFRAN-ODT) 4 MG disintegrating tablet Take 1 tablet (4 mg total) by mouth every 8 (eight) hours as needed for nausea or vomiting. 20 tablet Particia Nearing, [place]      PDMP not reviewed this encounter.   Particia Nearing, [place] 03/19/22 1929

## 2022-03-21 ENCOUNTER — Encounter (HOSPITAL_COMMUNITY): Payer: Self-pay

## 2022-03-21 ENCOUNTER — Ambulatory Visit (HOSPITAL_COMMUNITY)
Admission: EM | Admit: 2022-03-21 | Discharge: 2022-03-21 | Disposition: A | Discharge status: Home / Self Care | Payer: Medicaid Other | Attending: Internal Medicine | Admitting: Internal Medicine

## 2022-03-21 DIAGNOSIS — A084 Viral intestinal infection, unspecified: Secondary | ICD-10-CM

## 2022-03-21 MED ORDER — PROMETHAZINE HCL 12.5 MG PO TABS: 12.5000 mg | ORAL_TABLET | Freq: Four times a day (QID) | ORAL | 0 refills | Status: DC | PRN

## 2022-03-21 MED ORDER — ACETAMINOPHEN 500 MG PO TABS: 1000.0000 mg | ORAL_TABLET | Freq: Four times a day (QID) | ORAL | 0 refills | Status: AC | PRN

## 2022-03-21 NOTE — Discharge Instructions (Signed)
Take Phenergan 12.5 mg every 6 hours as needed for nausea and vomiting.  Do not take this medication and drive as it can make you sleepy.  Sip on water and Pedialyte to stay well-hydrated.  Attempted to drink as much water as you can to stay well-hydrated (at least 8 cups) if you are able to keep water down without vomiting.   Slowly increase your diet as tolerated.  Start with bananas, rice, applesauce, and white toast as these foods are easy on your stomach.  Work note is at the end of your packet.  If you develop any new or worsening symptoms or do not improve in the next 2 to 3 days, please return.  If your symptoms are severe, please go to the emergency room.  Follow-up with your primary care provider for further evaluation and management of your symptoms as well as ongoing wellness visits.  I hope you feel better!

## 2022-03-21 NOTE — ED Triage Notes (Signed)
Pt states N/V/D for the past week, states she went back to work today and is still not feeling better. States she needs a Chiropractor note for work. Seen here 2 days  ago for same.

## 2022-03-21 NOTE — ED Provider Notes (Signed)
[facility]    CSN: [id number] Arrival date & time: 03/21/22  1522      History   Chief Complaint Chief Complaint  Patient presents with   Nausea    HPI Ruth Snyder is a 21 y.o. female.   Patient presents to urgent care for evaluation of her nausea and vomiting that she has had over the last 5 days starting on Monday, March 17, 2022.  Patient also had a few episodes of diarrhea at the beginning of this illness that has since resolved.  Her aunt has been sick with the same symptoms.  She was seen in urgent care 2 days ago and prescribed Zofran to take for nausea and vomiting.  Patient states that she has been taking this as prescribed but still feels very nauseated and has continued to vomit approximately 15 to 20 minutes after eating.  She ate [employer] for lunch today, took Zofran before eating, and vomited after eating.  She has been attempting to drink plenty of water to maintain hydration.  She is reporting generalized abdominal pain and attributes this to the fact that she is "about to start her period".  Denies chance of pregnancy and states that she is not sexually active at this time.  No urinary symptoms reported.  She has headaches intermittently and takes Tylenol/migraine medication for this with relief.  She denies fever and chills at home, dizziness, lightheadedness, headache, shortness of breath, chest pain, back pain, throat pain, and nasal congestion.  She is mildly nauseous at this time.  Denies recent antibiotic use and past history of abdominal surgeries.  No aggravating or relieving factors identified at this time for patient's symptoms.     Past Medical History:  Diagnosis Date   GERD (gastroesophageal reflux disease)    Migraine    Speech delay     Patient Active Problem List   Diagnosis Date Noted   Migraine    Viral syndrome 02/23/2013   Dermatitis 02/23/2013   Speech delay     History reviewed. No pertinent surgical history.  OB History    No obstetric history on file.      Home Medications    Prior to Admission medications   Medication Sig Start Date End Date Taking? Authorizing Provider  acetaminophen (TYLENOL) 500 MG tablet Take 2 tablets (1,000 mg total) by mouth every 6 (six) hours as needed. 03/21/22  Yes Carlisle Beers, FNP  meloxicam (MOBIC) 15 MG tablet Take 1 tablet (15 mg total) by mouth daily. 12/26/21   Donita Brooks, MD  promethazine (PHENERGAN) 12.5 MG tablet Take 1 tablet (12.5 mg total) by mouth every 6 (six) hours as needed for nausea or vomiting. 03/21/22  Yes Carlisle Beers, FNP  ondansetron (ZOFRAN-ODT) 4 MG disintegrating tablet Take 1 tablet (4 mg total) by mouth every 8 (eight) hours as needed for nausea or vomiting. 03/19/22   Particia Nearing, PA-C    Family History History reviewed. No pertinent family history.  Social History Social History   Tobacco Use   Smoking status: Never    Passive exposure: Yes   Smokeless tobacco: Never  Vaping Use   Vaping Use: Never used  Substance Use Topics   Alcohol use: No    Alcohol/week: 0.0 standard drinks of alcohol   Drug use: No     Allergies   Patient has no known allergies.   Review of Systems Review of Systems Per HPI  Physical Exam Triage Vital Signs ED Triage  Vitals  Enc Vitals Group     BP 03/21/22 1600 (!) 125/91     Pulse Rate 03/21/22 1600 86     Resp 03/21/22 1600 16     Temp 03/21/22 1600 99.1 F (37.3 C)     Temp Source 03/21/22 1600 Oral     SpO2 03/21/22 1600 99 %     Weight --      Height --      Head Circumference --      Peak Flow --      Pain Score 03/21/22 1601 0     Pain Loc --      Pain Edu? --      Excl. in GC? --    No data found.  Updated Vital Signs BP (!) 125/91 (BP Location: Left Arm)   Pulse 86   Temp 99.1 F (37.3 C) (Oral)   Resp 16   LMP 02/20/2022 (Approximate)   SpO2 99%   Visual Acuity Right Eye Distance:   Left Eye Distance:   Bilateral Distance:     Right Eye Near:   Left Eye Near:    Bilateral Near:     Physical Exam Vitals and nursing note reviewed.  Constitutional:      Appearance: Normal appearance. She is not ill-appearing or toxic-appearing.     Comments: Very pleasant patient sitting on exam in position of comfort table in no acute distress.   HENT:     Head: Normocephalic and atraumatic.     Right Ear: Hearing and external ear normal.     Left Ear: Hearing and external ear normal.     Nose: Nose normal. No congestion.     Mouth/Throat:     Lips: Pink.     Mouth: Mucous membranes are moist.     Pharynx: No posterior oropharyngeal erythema.  Eyes:     General: Lids are normal. Vision grossly intact. Gaze aligned appropriately.     Extraocular Movements: Extraocular movements intact.     Conjunctiva/sclera: Conjunctivae normal.  Cardiovascular:     Rate and Rhythm: Normal rate and regular rhythm.     Heart sounds: Normal heart sounds, S1 normal and S2 normal.  Pulmonary:     Effort: Pulmonary effort is normal. No respiratory distress.     Breath sounds: Normal breath sounds and air entry.  Abdominal:     General: Bowel sounds are normal.     Palpations: Abdomen is soft.     Tenderness: There is no abdominal tenderness. There is no right CVA tenderness or left CVA tenderness.  Musculoskeletal:     Cervical back: Neck supple.  Skin:    General: Skin is warm and dry.     Capillary Refill: Capillary refill takes less than 2 seconds.     Findings: No rash.  Neurological:     General: No focal deficit present.     Mental Status: She is alert and oriented to person, place, and time. Mental status is at baseline.     Cranial Nerves: No dysarthria or facial asymmetry.     Gait: Gait is intact.  Psychiatric:        Mood and Affect: Mood normal.        Speech: Speech normal.        Behavior: Behavior normal.        Thought Content: Thought content normal.        Judgment: Judgment normal.      UC Treatments /  Results  Labs (all labs ordered are listed, but only abnormal results are displayed) Labs Reviewed - No data to display  EKG   Radiology No results found.  Procedures Procedures (including critical care time)  Medications Ordered in UC Medications - No data to display  Initial Impression / Assessment and Plan / UC Course  I have reviewed the triage vital signs and the nursing notes.  Pertinent labs & imaging results that were available during my care of the patient were reviewed by me and considered in my medical decision making (see chart for details).  1.  Viral gastroenteritis Symptomology and physical exam are consistent with viral gastroenteritis.  Patient is well-appearing with stable abdominal exam and hemodynamically stable vital signs.  Plan to treat nausea and vomiting with Phenergan 12.5 mg every 6 hours as she is not responding well to Zofran therapy.  Encourage patient to increase water intake and she may drink Pedialyte to maintain adequate hydration due to length of symptoms.  She is not having any diarrhea and her last normal bowel movement was this morning.  Patient advised to slowly increase her diet as tolerated. BRAT diet advised.  Work note given for patient to return to work on Monday, March 24, 2022.  Encourage patient to rest over the weekend.   Discussed physical exam and available lab work findings in clinic with patient.  Counseled patient regarding appropriate use of medications and potential side effects for all medications recommended or prescribed today. Discussed red flag signs and symptoms of worsening condition,when to call the PCP office, return to urgent care, and when to seek higher level of care in the emergency department. Patient verbalizes understanding and agreement with plan. All questions answered. Patient discharged in stable condition.  Final Clinical Impressions(s) / UC Diagnoses   Final diagnoses:  Viral gastroenteritis     Discharge  Instructions      Take Phenergan 12.5 mg every 6 hours as needed for nausea and vomiting.  Do not take this medication and drive as it can make you sleepy.  Sip on water and Pedialyte to stay well-hydrated.  Attempted to drink as much water as you can to stay well-hydrated (at least 8 cups) if you are able to keep water down without vomiting.   Slowly increase your diet as tolerated.  Start with bananas, rice, applesauce, and white toast as these foods are easy on your stomach.  Work note is at the end of your packet.  If you develop any new or worsening symptoms or do not improve in the next 2 to 3 days, please return.  If your symptoms are severe, please go to the emergency room.  Follow-up with your primary care provider for further evaluation and management of your symptoms as well as ongoing wellness visits.  I hope you feel better!     ED Prescriptions     Medication Sig Dispense Auth. Provider   promethazine (PHENERGAN) 12.5 MG tablet Take 1 tablet (12.5 mg total) by mouth every 6 (six) hours as needed for nausea or vomiting. 15 tablet Reita May M, FNP   acetaminophen (TYLENOL) 500 MG tablet Take 2 tablets (1,000 mg total) by mouth every 6 (six) hours as needed. 30 tablet Carlisle Beers, FNP      PDMP not reviewed this encounter.   Carlisle Beers, [place] 03/21/22 1739

## 2022-03-31 ENCOUNTER — Ambulatory Visit (INDEPENDENT_AMBULATORY_CARE_PROVIDER_SITE_OTHER): Payer: Medicaid Other | Admitting: Family Medicine

## 2022-03-31 ENCOUNTER — Encounter: Payer: Self-pay | Admitting: Family Medicine

## 2022-03-31 VITALS — BP 150/100 | HR 100 | Temp 98.5°F | Resp 16 | Wt 181.0 lb

## 2022-03-31 DIAGNOSIS — L02211 Cutaneous abscess of abdominal wall: Secondary | ICD-10-CM | POA: Diagnosis not present

## 2022-03-31 MED ORDER — SULFAMETHOXAZOLE-TRIMETHOPRIM 800-160 MG PO TABS: 1.0000 | ORAL_TABLET | Freq: Two times a day (BID) | ORAL | 0 refills | Status: DC

## 2022-03-31 NOTE — Progress Notes (Signed)
Subjective:    Patient ID: Ruth Snyder, female    DOB: 26-Aug-2001, 21 y.o.   MRN: [id number]  HPI Patient reports a boil in her left axilla as well as a boil in her pelvic area.  She states that it hurts to walk.  She states that it will not drain.  It is erythematous warm and tender. Past Medical History:  Diagnosis Date   GERD (gastroesophageal reflux disease)    Migraine    Speech delay    No past surgical history on file. Current Outpatient Medications on File Prior to Visit  Medication Sig Dispense Refill   meloxicam (MOBIC) 15 MG tablet Take 1 tablet (15 mg total) by mouth daily. 30 tablet 0   acetaminophen (TYLENOL) 500 MG tablet Take 2 tablets (1,000 mg total) by mouth every 6 (six) hours as needed. 30 tablet 0   ondansetron (ZOFRAN-ODT) 4 MG disintegrating tablet Take 1 tablet (4 mg total) by mouth every 8 (eight) hours as needed for nausea or vomiting. 20 tablet 0   promethazine (PHENERGAN) 12.5 MG tablet Take 1 tablet (12.5 mg total) by mouth every 6 (six) hours as needed for nausea or vomiting. 15 tablet 0   No current facility-administered medications on file prior to visit.   No Known Allergies  Social History   Socioeconomic History   Marital status: Single    Spouse name: Not on file   Number of children: Not on file   Years of education: Not on file   Highest education level: Not on file  Occupational History   Not on file  Tobacco Use   Smoking status: Never    Passive exposure: Yes   Smokeless tobacco: Never  Vaping Use   Vaping Use: Never used  Substance and Sexual Activity   Alcohol use: No    Alcohol/week: 0.0 standard drinks of alcohol   Drug use: No   Sexual activity: Never    Birth control/protection: None  Other Topics Concern   Not on file  Social History Narrative   ** Merged History Encounter **       Social Determinants of Health   Financial Resource Strain: Not on file  Food Insecurity: Not on file  Transportation Needs:  Not on file  Physical Activity: Not on file  Stress: Not on file  Social Connections: Not on file  Intimate Partner Violence: Not on file      Review of Systems  All other systems reviewed and are negative.      Objective:   Physical Exam Constitutional:      Appearance: Normal appearance. She is not ill-appearing or toxic-appearing.  Cardiovascular:     Rate and Rhythm: Normal rate and regular rhythm.     Heart sounds: Normal heart sounds. No murmur heard. Pulmonary:     Effort: Pulmonary effort is normal. No respiratory distress.     Breath sounds: Normal breath sounds. No wheezing, rhonchi or rales.  Musculoskeletal:     Cervical back: Neck supple.  Lymphadenopathy:     Cervical: No cervical adenopathy.  Neurological:     Mental Status: She is alert.     Patient has a 1 cm cystlike mass in the left axilla.  There is no overlying erythema or warmth.  Patient has a 2 cm erythematous bowl in the hairline of her perineum.  It is tender and warm and fluctuant    Assessment & Plan:  Abscess of abdominal wall Patient has an abscess to the  lower abdominal wall just above the hairline of her pubic area.  Area was anesthetized with 0.1% lidocaine with epinephrine.  A 1 cm vertical incision was made in the abscess and purulent material was expressed.  Wound was then packed with 40 inches of 1/4 inch iodoform gauze.  Wound care was discussed.  Begin Bactrim double strength tablets twice daily for 7 days.  I believe the lesion in the left axilla should resolve with warm compresses and antibiotics as it does not appear to require incision and drainage today

## 2022-05-01 ENCOUNTER — Encounter: Payer: Self-pay | Admitting: Emergency Medicine

## 2022-05-01 ENCOUNTER — Ambulatory Visit
Admission: EM | Admit: 2022-05-01 | Discharge: 2022-05-01 | Disposition: A | Discharge status: Home / Self Care | Payer: Medicaid Other | Attending: Family Medicine | Admitting: Family Medicine

## 2022-05-01 ENCOUNTER — Ambulatory Visit (INDEPENDENT_AMBULATORY_CARE_PROVIDER_SITE_OTHER): Payer: Medicaid Other

## 2022-05-01 ENCOUNTER — Other Ambulatory Visit: Payer: Self-pay

## 2022-05-01 DIAGNOSIS — S9031XA Contusion of right foot, initial encounter: Secondary | ICD-10-CM

## 2022-05-01 DIAGNOSIS — M79671 Pain in right foot: Secondary | ICD-10-CM

## 2022-05-01 NOTE — ED Triage Notes (Signed)
Pt reports dropped step stool on right foot x2 days ago at work. Pt reports increased pain with ambulation and right foot pain ever since.

## 2022-05-01 NOTE — ED Provider Notes (Signed)
[facility]    CSN: [id number] Arrival date & time: 05/01/22  1906      History   Chief Complaint Chief Complaint  Patient presents with   Foot Pain    HPI Ruth Snyder is a 21 y.o. female.   Patient presenting today with 2-day history of bruising, swelling to the base of the right great toe after dropping something heavy on her toe.  She is able to move the area but painful to do so.  Some tingling sensation but no numbness, weakness, loss of range of motion, skin injury.  Has not tried anything over-the-counter for symptoms thus far.    Past Medical History:  Diagnosis Date   GERD (gastroesophageal reflux disease)    Migraine    Speech delay     Patient Active Problem List   Diagnosis Date Noted   Migraine    Viral syndrome 02/23/2013   Dermatitis 02/23/2013   Speech delay     History reviewed. No pertinent surgical history.  OB History   No obstetric history on file.      Home Medications    Prior to Admission medications   Medication Sig Start Date End Date Taking? Authorizing Provider  acetaminophen (TYLENOL) 500 MG tablet Take 2 tablets (1,000 mg total) by mouth every 6 (six) hours as needed. 03/21/22   Carlisle Beers, FNP  meloxicam (MOBIC) 15 MG tablet Take 1 tablet (15 mg total) by mouth daily. 12/26/21   Donita Brooks, MD  ondansetron (ZOFRAN-ODT) 4 MG disintegrating tablet Take 1 tablet (4 mg total) by mouth every 8 (eight) hours as needed for nausea or vomiting. 03/19/22   Particia Nearing, PA-C  promethazine (PHENERGAN) 12.5 MG tablet Take 1 tablet (12.5 mg total) by mouth every 6 (six) hours as needed for nausea or vomiting. 03/21/22   Carlisle Beers, FNP  sulfamethoxazole-trimethoprim (BACTRIM DS) 800-160 MG tablet Take 1 tablet by mouth 2 (two) times daily. 03/31/22   Donita Brooks, MD    Family History History reviewed. No pertinent family history.  Social History Social History   Tobacco Use    Smoking status: Never    Passive exposure: Yes   Smokeless tobacco: Never  Vaping Use   Vaping Use: Never used  Substance Use Topics   Alcohol use: No    Alcohol/week: 0.0 standard drinks of alcohol   Drug use: No     Allergies   Patient has no known allergies.   Review of Systems Review of Systems Per HPI  Physical Exam Triage Vital Signs ED Triage Vitals  Enc Vitals Group     BP 05/01/22 1953 (!) 150/99     Pulse Rate 05/01/22 1953 91     Resp 05/01/22 1953 20     Temp 05/01/22 1953 98 F (36.7 C)     Temp Source 05/01/22 1953 Oral     SpO2 05/01/22 1953 98 %     Weight --      Height --      Head Circumference --      Peak Flow --      Pain Score 05/01/22 1954 8     Pain Loc --      Pain Edu? --      Excl. in GC? --    No data found.  Updated Vital Signs BP (!) 150/99 (BP Location: Right Arm)   Pulse 91   Temp 98 F (36.7 C) (Oral)   Resp 20  SpO2 98%   Visual Acuity Right Eye Distance:   Left Eye Distance:   Bilateral Distance:    Right Eye Near:   Left Eye Near:    Bilateral Near:     Physical Exam Vitals and nursing note reviewed.  Constitutional:      Appearance: Normal appearance. She is not ill-appearing.  HENT:     Head: Atraumatic.  Eyes:     Extraocular Movements: Extraocular movements intact.     Conjunctiva/sclera: Conjunctivae normal.  Cardiovascular:     Rate and Rhythm: Normal rate and regular rhythm.     Heart sounds: Normal heart sounds.  Pulmonary:     Effort: Pulmonary effort is normal.     Breath sounds: Normal breath sounds.  Musculoskeletal:        General: Normal range of motion.     Cervical back: Normal range of motion and neck supple.  Skin:    General: Skin is warm and dry.     Findings: Bruising present.     Comments: Area of edema, bruising to the base of right great toe  Neurological:     Mental Status: She is alert and oriented to person, place, and time.     Comments: Right foot neurovascularly  intact  Psychiatric:        Mood and Affect: Mood normal.        Thought Content: Thought content normal.        Judgment: Judgment normal.     UC Treatments / Results  Labs (all labs ordered are listed, but only abnormal results are displayed) Labs Reviewed - No data to display  EKG   Radiology DG Foot Complete Right  Result Date: 05/01/2022 CLINICAL DATA:  Status post trauma. EXAM: RIGHT FOOT COMPLETE - 3+ VIEW COMPARISON:  None Available. FINDINGS: There is no evidence of fracture or dislocation. There is no evidence of arthropathy or other focal bone abnormality. Mild soft tissue swelling is seen along the dorsal aspect of the mid right foot. IMPRESSION: Mild dorsal soft tissue swelling without evidence of an acute osseous abnormality. Electronically Signed   By: Aram Candela M.D.   On: 05/01/2022 19:48    Procedures Procedures (including critical care time)  Medications Ordered in UC Medications - No data to display  Initial Impression / Assessment and Plan / UC Course  I have reviewed the triage vital signs and the nursing notes.  Pertinent labs & imaging results that were available during my care of the patient were reviewed by me and considered in my medical decision making (see chart for details).     X-ray of the right foot negative for acute bony abnormality.  Consistent with contusion to the right foot.  RICE protocol, postop shoe given for comfort, over-the-counter pain relievers.  Work note given.  Return for worsening symptoms.  Final Clinical Impressions(s) / UC Diagnoses   Final diagnoses:  Contusion of right foot, initial encounter   Discharge Instructions   None    ED Prescriptions   None    PDMP not reviewed this encounter.   Particia Nearing, [place] 05/01/22 2003

## 2022-05-13 ENCOUNTER — Ambulatory Visit
Admission: EM | Admit: 2022-05-13 | Discharge: 2022-05-13 | Disposition: A | Discharge status: Home / Self Care | Payer: Medicaid Other | Attending: Nurse Practitioner | Admitting: Nurse Practitioner

## 2022-05-13 DIAGNOSIS — R112 Nausea with vomiting, unspecified: Secondary | ICD-10-CM | POA: Diagnosis not present

## 2022-05-13 DIAGNOSIS — J069 Acute upper respiratory infection, unspecified: Secondary | ICD-10-CM | POA: Diagnosis present

## 2022-05-13 DIAGNOSIS — R519 Headache, unspecified: Secondary | ICD-10-CM | POA: Insufficient documentation

## 2022-05-13 DIAGNOSIS — Z1152 Encounter for screening for COVID-19: Secondary | ICD-10-CM | POA: Insufficient documentation

## 2022-05-13 MED ORDER — KETOROLAC TROMETHAMINE 30 MG/ML IJ SOLN
30.0000 mg | Freq: Once | INTRAMUSCULAR | Status: AC
  Administered 2022-05-13: 30 mg via INTRAMUSCULAR

## 2022-05-13 MED ORDER — PROMETHAZINE HCL 12.5 MG PO TABS: 12.5000 mg | ORAL_TABLET | Freq: Four times a day (QID) | ORAL | 0 refills | Status: DC | PRN

## 2022-05-13 NOTE — ED Provider Notes (Signed)
[facility]    CSN: [id number] Arrival date & time: 05/13/22  1408      History   Chief Complaint Chief Complaint  Patient presents with   Cough    HPI Ruth Snyder is a 21 y.o. female.   Patient presents with 2 days of upper respiratory symptoms.  She endorses body aches, chills, dry cough, abdominal pain, nausea, vomiting after eating, and decreased appetite, and fatigue.  She does have a severe headache that will not go away per her report.  Has taken Tylenol for the headache without relief.  She denies shortness of breath, wheezing, chest pain or tightness, chest or nasal congestion, sore throat, sinus pressure, tooth or ear pain or pressure, and diarrhea.  No new rash.  Has taken some of her aunts Phenergan for the nausea which does help.  Reports her whole family is sick with similar symptoms and think they have a new strain of COVID-19.    Past Medical History:  Diagnosis Date   GERD (gastroesophageal reflux disease)    Migraine    Speech delay     Patient Active Problem List   Diagnosis Date Noted   Migraine    Viral syndrome 02/23/2013   Dermatitis 02/23/2013   Speech delay     History reviewed. No pertinent surgical history.  OB History   No obstetric history on file.      Home Medications    Prior to Admission medications   Medication Sig Start Date End Date Taking? Authorizing Provider  acetaminophen (TYLENOL) 500 MG tablet Take 2 tablets (1,000 mg total) by mouth every 6 (six) hours as needed. 03/21/22   Carlisle Beers, FNP  meloxicam (MOBIC) 15 MG tablet Take 1 tablet (15 mg total) by mouth daily. 12/26/21   Donita Brooks, MD  promethazine (PHENERGAN) 12.5 MG tablet Take 1 tablet (12.5 mg total) by mouth every 6 (six) hours as needed for nausea or vomiting. 05/13/22   Valentino Nose, NP    Family History History reviewed. No pertinent family history.  Social History Social History   Tobacco Use   Smoking status:  Never    Passive exposure: Yes   Smokeless tobacco: Never  Vaping Use   Vaping Use: Never used  Substance Use Topics   Alcohol use: No    Alcohol/week: 0.0 standard drinks of alcohol   Drug use: No     Allergies   Patient has no known allergies.   Review of Systems Review of Systems Per HPI  Physical Exam Triage Vital Signs ED Triage Vitals  Enc Vitals Group     BP 05/13/22 1517 113/79     Pulse Rate 05/13/22 1517 (!) 111     Resp 05/13/22 1517 18     Temp 05/13/22 1517 98.7 F (37.1 C)     Temp Source 05/13/22 1517 Oral     SpO2 05/13/22 1517 98 %     Weight --      Height --      Head Circumference --      Peak Flow --      Pain Score 05/13/22 1516 8     Pain Loc --      Pain Edu? --      Excl. in GC? --    No data found.  Updated Vital Signs BP 113/79 (BP Location: Right Arm)   Pulse (!) 111   Temp 98.7 F (37.1 C) (Oral)   Resp 18  SpO2 98%   Visual Acuity Right Eye Distance:   Left Eye Distance:   Bilateral Distance:    Right Eye Near:   Left Eye Near:    Bilateral Near:     Physical Exam Vitals and nursing note reviewed.  Constitutional:      General: She is not in acute distress.    Appearance: Normal appearance. She is not ill-appearing or toxic-appearing.  HENT:     Head: Normocephalic and atraumatic.     Right Ear: Tympanic membrane, ear canal and external ear normal.     Left Ear: Tympanic membrane, ear canal and external ear normal.     Nose: No congestion or rhinorrhea.     Mouth/Throat:     Mouth: Mucous membranes are moist.     Pharynx: Oropharynx is clear. Posterior oropharyngeal erythema present. No oropharyngeal exudate.  Eyes:     General: No scleral icterus.    Extraocular Movements: Extraocular movements intact.  Cardiovascular:     Rate and Rhythm: Normal rate and regular rhythm.  Pulmonary:     Effort: Pulmonary effort is normal. No respiratory distress.     Breath sounds: Normal breath sounds. No wheezing, rhonchi  or rales.  Abdominal:     General: Abdomen is flat. Bowel sounds are normal. There is no distension.     Palpations: Abdomen is soft.     Tenderness: There is no abdominal tenderness. There is no right CVA tenderness, left CVA tenderness, guarding or rebound.  Musculoskeletal:     Cervical back: Normal range of motion and neck supple.  Lymphadenopathy:     Cervical: Cervical adenopathy present.  Skin:    General: Skin is warm and dry.     Coloration: Skin is not jaundiced or pale.     Findings: No erythema or rash.  Neurological:     Mental Status: She is alert and oriented to person, place, and time.  Psychiatric:        Behavior: Behavior is cooperative.      UC Treatments / Results  Labs (all labs ordered are listed, but only abnormal results are displayed) Labs Reviewed  SARS CORONAVIRUS 2 (TAT 6-24 HRS)    EKG   Radiology No results found.  Procedures Procedures (including critical care time)  Medications Ordered in UC Medications  ketorolac (TORADOL) 30 MG/ML injection 30 mg (30 mg Intramuscular Given 05/13/22 1552)    Initial Impression / Assessment and Plan / UC Course  I have reviewed the triage vital signs and the nursing notes.  Pertinent labs & imaging results that were available during my care of the patient were reviewed by me and considered in my medical decision making (see chart for details).    Patient is well-appearing, afebrile, normotensive, slightly tachycardic, not tachypneic, oxygenating well on room air.  Suspect she may be slightly dehydrated to explain tachycardia.  Discussed with patient that symptoms likely viral in etiology.  COVID-19 testing obtained.  Supportive care discussed.  Toradol 30 mg IM given today in urgent care to help with headache.  Refill sent for pain Phenergan to help with nausea/vomiting.  Recommended hydration with plenty of fluids.  ER precautions and return precautions discussed.  The patient was given the opportunity  to ask questions.  All questions answered to their satisfaction.  The patient is in agreement to this plan.   Final Clinical Impressions(s) / UC Diagnoses   Final diagnoses:  Intractable headache, unspecified chronicity pattern, unspecified headache type  Viral URI with  cough  Encounter for screening for COVID-19  Nausea and vomiting, unspecified vomiting type     Discharge Instructions      Your symptoms and exam findings are most consistent with a viral upper respiratory infection. These usually run their course in about 10 days.  If your symptoms last longer than 10 days without improvement, please follow up with your primary care provider.  If your symptoms, worsen, please go to the Emergency Room.    We have tested you today for COVID-19.  You will see the results in Mychart and we will call you with positive results.    Please stay home and isolate until you are aware of the results.    Some things that can make you feel better are: - Increased rest - Increasing fluid with water/sugar free electrolytes - Acetaminophen and ibuprofen as needed for fever/pain.  - Salt water gargling, chloraseptic spray and throat lozenges - OTC guaifenesin (Mucinex) 600 mg twice daily to help with congestion.  - Saline sinus flushes or a neti pot.  - Humidifying the air. - Phenergan every 8 hours as needed for nausea/vomiting.   We have given you a shot of Toradol today to help with the headache.     ED Prescriptions     Medication Sig Dispense Auth. Provider   promethazine (PHENERGAN) 12.5 MG tablet Take 1 tablet (12.5 mg total) by mouth every 6 (six) hours as needed for nausea or vomiting. 15 tablet Valentino Nose, NP      PDMP not reviewed this encounter.   Valentino Nose, NP 05/13/22 1739

## 2022-05-13 NOTE — Discharge Instructions (Signed)
Your symptoms and exam findings are most consistent with a viral upper respiratory infection. These usually run their course in about 10 days.  If your symptoms last longer than 10 days without improvement, please follow up with your primary care provider.  If your symptoms, worsen, please go to the Emergency Room.    We have tested you today for COVID-19.  You will see the results in Mychart and we will call you with positive results.    Please stay home and isolate until you are aware of the results.    Some things that can make you feel better are: - Increased rest - Increasing fluid with water/sugar free electrolytes - Acetaminophen and ibuprofen as needed for fever/pain.  - Salt water gargling, chloraseptic spray and throat lozenges - OTC guaifenesin (Mucinex) 600 mg twice daily to help with congestion.  - Saline sinus flushes or a neti pot.  - Humidifying the air. - Phenergan every 8 hours as needed for nausea/vomiting.   We have given you a shot of Toradol today to help with the headache.

## 2022-05-13 NOTE — ED Triage Notes (Addendum)
Pt present coughing and congestion with chills, symptoms started on Sunday.pt states everyone in her family tested positive for covid.

## 2022-05-14 LAB — SARS CORONAVIRUS 2 (TAT 6-24 HRS): SARS Coronavirus 2: NEGATIVE

## 2022-07-25 ENCOUNTER — Emergency Department (HOSPITAL_COMMUNITY)
Admission: EM | Admit: 2022-07-25 | Discharge: 2022-07-25 | Disposition: A | Discharge status: Home / Self Care | Payer: Medicaid Other | Attending: Emergency Medicine | Admitting: Emergency Medicine

## 2022-07-25 ENCOUNTER — Ambulatory Visit
Admission: EM | Admit: 2022-07-25 | Discharge: 2022-07-25 | Disposition: A | Discharge status: Home / Self Care | Payer: Medicaid Other

## 2022-07-25 ENCOUNTER — Other Ambulatory Visit: Payer: Self-pay

## 2022-07-25 DIAGNOSIS — S0990XA Unspecified injury of head, initial encounter: Secondary | ICD-10-CM

## 2022-07-25 DIAGNOSIS — W01190A Fall on same level from slipping, tripping and stumbling with subsequent striking against furniture, initial encounter: Secondary | ICD-10-CM | POA: Insufficient documentation

## 2022-07-25 DIAGNOSIS — R519 Headache, unspecified: Secondary | ICD-10-CM | POA: Diagnosis present

## 2022-07-25 DIAGNOSIS — S060X0A Concussion without loss of consciousness, initial encounter: Secondary | ICD-10-CM | POA: Insufficient documentation

## 2022-07-25 MED ORDER — ONDANSETRON 4 MG PO TBDP
4.0000 mg | ORAL_TABLET | Freq: Once | ORAL | Status: AC
  Administered 2022-07-25: 4 mg via ORAL
  Filled 2022-07-25: qty 1

## 2022-07-25 MED ORDER — KETOROLAC TROMETHAMINE 60 MG/2ML IM SOLN
60.0000 mg | Freq: Once | INTRAMUSCULAR | Status: AC
  Administered 2022-07-25: 60 mg via INTRAMUSCULAR
  Filled 2022-07-25: qty 2

## 2022-07-25 MED ORDER — ONDANSETRON 4 MG PO TBDP: 4.0000 mg | ORAL_TABLET | Freq: Three times a day (TID) | ORAL | 0 refills | Status: DC | PRN

## 2022-07-25 MED ORDER — NAPROXEN 500 MG PO TABS: 500.0000 mg | ORAL_TABLET | Freq: Two times a day (BID) | ORAL | 0 refills | Status: AC

## 2022-07-25 NOTE — ED Provider Notes (Signed)
[facility]    CSN: [id number] Arrival date & time: 07/25/22  1622      History   Chief Complaint No chief complaint on file.   HPI Ruth Snyder is a 21 y.o. female.   The history is provided by the patient.   Patient presents for complaints of head injury that occurred last evening when she hit her head on the side of a coffee table.  Patient states since that time, she has had weight, photophobia, nausea, dizziness, lightheadedness, difficulty focusing and trouble sleeping.  Patient states that she also feels "shaky".  Patient denies loss of consciousness.  She states that she has not eaten anything today.  Patient states that she currently does not take any blood thinners.  She has not taken any medication for her symptoms.  Past Medical History:  Diagnosis Date   GERD (gastroesophageal reflux disease)    Migraine    Speech delay     Patient Active Problem List   Diagnosis Date Noted   Migraine    Viral syndrome 02/23/2013   Dermatitis 02/23/2013   Speech delay     History reviewed. No pertinent surgical history.  OB History   No obstetric history on file.      Home Medications    Prior to Admission medications   Medication Sig Start Date End Date Taking? Authorizing Provider  acetaminophen (TYLENOL) 500 MG tablet Take 2 tablets (1,000 mg total) by mouth every 6 (six) hours as needed. 03/21/22   Carlisle Beers, FNP  meloxicam (MOBIC) 15 MG tablet Take 1 tablet (15 mg total) by mouth daily. 12/26/21   Donita Brooks, MD  promethazine (PHENERGAN) 12.5 MG tablet Take 1 tablet (12.5 mg total) by mouth every 6 (six) hours as needed for nausea or vomiting. 05/13/22   Valentino Nose, NP    Family History History reviewed. No pertinent family history.  Social History Social History   Tobacco Use   Smoking status: Never    Passive exposure: Yes   Smokeless tobacco: Never  Vaping Use   Vaping Use: Never used  Substance Use Topics    Alcohol use: No    Alcohol/week: 0.0 standard drinks of alcohol   Drug use: No     Allergies   Patient has no known allergies.   Review of Systems Review of Systems Per HPI  Physical Exam Triage Vital Signs ED Triage Vitals  Enc Vitals Group     BP 07/25/22 1639 115/79     Pulse Rate 07/25/22 1639 91     Resp 07/25/22 1639 16     Temp 07/25/22 1639 98.7 F (37.1 C)     Temp Source 07/25/22 1639 Oral     SpO2 07/25/22 1639 99 %     Weight --      Height --      Head Circumference --      Peak Flow --      Pain Score 07/25/22 1638 8     Pain Loc --      Pain Edu? --      Excl. in GC? --    No data found.  Updated Vital Signs BP 115/79 (BP Location: Right Arm)   Pulse 91   Temp 98.7 F (37.1 C) (Oral)   Resp 16   LMP  (Within Months) Comment: 1 month  SpO2 99%   Visual Acuity Right Eye Distance:   Left Eye Distance:   Bilateral Distance:  Right Eye Near:   Left Eye Near:    Bilateral Near:     Physical Exam Vitals and nursing note reviewed.  Constitutional:      Comments: Slow to respond with garbled speech  HENT:     Head: Normocephalic.  Cardiovascular:     Rate and Rhythm: Normal rate and regular rhythm.     Pulses: Normal pulses.     Heart sounds: Normal heart sounds.  Pulmonary:     Effort: Pulmonary effort is normal.     Breath sounds: Normal breath sounds.  Abdominal:     General: Bowel sounds are normal.     Palpations: Abdomen is soft.  Skin:    General: Skin is warm.  Neurological:     General: No focal deficit present.     Mental Status: She is alert and oriented to person, place, and time.     GCS: GCS eye subscore is 4. GCS verbal subscore is 5. GCS motor subscore is 6.     Cranial Nerves: No facial asymmetry.     Motor: No tremor.     Coordination: Coordination is intact.     Gait: Gait is intact.     Comments: Unable to raise her eyebrows.  Psychiatric:        Mood and Affect: Mood normal.        Behavior: Behavior  normal.      UC Treatments / Results  Labs (all labs ordered are listed, but only abnormal results are displayed) Labs Reviewed - No data to display  EKG   Radiology No results found.  Procedures Procedures (including critical care time)  Medications Ordered in UC Medications - No data to display  Initial Impression / Assessment and Plan / UC Course  I have reviewed the triage vital signs and the nursing notes.  Pertinent labs & imaging results that were available during my care of the patient were reviewed by me and considered in my medical decision making (see chart for details).  Patient presents with complaints of head injury that occurred last evening.  On exam, her speech is garbled, she is slow to respond to questions asked, and she is unable to raise her eyebrows.  Based on her physical exam, recommended that she go to the emergency department for further evaluation.  Patient's signs are stable, she is able to travel via private vehicle.  Patient discharged to the emergency department.  Final Clinical Impressions(s) / UC Diagnoses   Final diagnoses:  Injury of head, initial encounter     Discharge Instructions      Go to the emergency department for further evaluation.      ED Prescriptions   None    PDMP not reviewed this encounter.   Tish Men, NP 07/25/22 1706

## 2022-07-25 NOTE — ED Notes (Signed)
Patient is being discharged from the Urgent Care and sent to the Emergency Department via POV . Per NP, patient is in need of higher level of care due to altered mental status/speech changes from head injury. Patient is aware and verbalizes understanding of plan of care.  Vitals:   07/25/22 1639  BP: 115/79  Pulse: 91  Resp: 16  Temp: 98.7 F (37.1 C)  SpO2: 99%

## 2022-07-25 NOTE — ED Triage Notes (Signed)
Pt c/o posterior head pain, light sensitivity, and n/v r/t hitting head on a table last night.  Pain score 8/10.  Pt reports she leaned down to pick up headphones and hit head on a table when she was raising up.    Pt answering questions appropriately.

## 2022-07-25 NOTE — ED Provider Notes (Signed)
[facility] EMERGENCY DEPARTMENT Provider Note   CSN: [id number] Arrival date & time: 07/25/22  1657     History  Chief Complaint  Patient presents with   Headache    Ruth Snyder is a 21 y.o. female.   Headache  This patient is a 21 year old female, she has no significant chronic medical history, she presents to the hospital after sustaining a minor head injury that occurred last night when she bent over to pick something up off the ground and as she raised her head she struck the back of her head against a coffee table.  This was a seemingly minor injury but she has had headache and difficulty sleeping overnight, she has had nausea and one episode of vomiting today, she has sensitivity to light, sensitivity to sound, increasing dizziness and sleepiness.  She was sent from urgent care because of the symptoms.  Patient states she does have occasional headaches but has never had a concussion before.  She does not take anticoagulants.    Home Medications Prior to Admission medications   Medication Sig Start Date End Date Taking? Authorizing Provider  naproxen (NAPROSYN) 500 MG tablet Take 1 tablet (500 mg total) by mouth 2 (two) times daily with a meal. 07/25/22  Yes Eber Hong, MD  ondansetron (ZOFRAN-ODT) 4 MG disintegrating tablet Take 1 tablet (4 mg total) by mouth every 8 (eight) hours as needed for nausea. 07/25/22  Yes Eber Hong, MD  acetaminophen (TYLENOL) 500 MG tablet Take 2 tablets (1,000 mg total) by mouth every 6 (six) hours as needed. 03/21/22   Carlisle Beers, FNP  meloxicam (MOBIC) 15 MG tablet Take 1 tablet (15 mg total) by mouth daily. 12/26/21   Donita Brooks, MD  promethazine (PHENERGAN) 12.5 MG tablet Take 1 tablet (12.5 mg total) by mouth every 6 (six) hours as needed for nausea or vomiting. 05/13/22   Valentino Nose, NP      Allergies    Patient has no known allergies.    Review of Systems   Review of Systems  Neurological:   Positive for headaches.  All other systems reviewed and are negative.   Physical Exam Updated Vital Signs BP (!) 130/91 (BP Location: Right Arm)   Pulse 85   Temp 98.3 F (36.8 C) (Oral)   Resp 16   Ht 1.575 m (5\' 2" )   Wt 82.1 kg   LMP  (Within Months)   SpO2 99%   BMI 33.11 kg/m  Physical Exam Vitals and nursing note reviewed.  Constitutional:      General: She is not in acute distress.    Appearance: She is well-developed.  HENT:     Head: Normocephalic and atraumatic.     Comments: She has tenderness on the posterior occiput but no hematoma or other signs of trauma    Mouth/Throat:     Pharynx: No oropharyngeal exudate.  Eyes:     General: No scleral icterus.       Right eye: No discharge.        Left eye: No discharge.     Conjunctiva/sclera: Conjunctivae normal.     Pupils: Pupils are equal, round, and reactive to light.  Neck:     Thyroid: No thyromegaly.     Vascular: No JVD.  Cardiovascular:     Rate and Rhythm: Normal rate and regular rhythm.     Heart sounds: Normal heart sounds. No murmur heard.    No friction rub. No gallop.  Pulmonary:  Effort: Pulmonary effort is normal. No respiratory distress.     Breath sounds: Normal breath sounds. No wheezing or rales.  Abdominal:     General: Bowel sounds are normal. There is no distension.     Palpations: Abdomen is soft. There is no mass.     Tenderness: There is no abdominal tenderness.  Musculoskeletal:        General: No tenderness. Normal range of motion.     Cervical back: Normal range of motion and neck supple.  Lymphadenopathy:     Cervical: No cervical adenopathy.  Skin:    General: Skin is warm and dry.     Findings: No erythema or rash.  Neurological:     Mental Status: She is alert.     Coordination: Coordination normal.     Comments: Speech is clear, cranial nerves III through XII are intact, memory is intact, strength is normal in all 4 extremities including grips, sensation is intact to  light touch and pinprick in all 4 extremities. Coordination as tested by finger-nose-finger is normal, no limb ataxia. Normal gait, normal reflexes at the patellar tendons bilaterally  Psychiatric:        Behavior: Behavior normal.     ED Results / Procedures / Treatments   Labs (all labs ordered are listed, but only abnormal results are displayed) Labs Reviewed - No data to display  EKG None  Radiology No results found.  Procedures Procedures    Medications Ordered in ED Medications  ketorolac (TORADOL) injection 60 mg (has no administration in time range)  ondansetron (ZOFRAN-ODT) disintegrating tablet 4 mg (has no administration in time range)    ED Course/ Medical Decision Making/ A&P                           Medical Decision Making Risk Prescription drug management.   This patient has normal speech, normal coordination and strength in all 4 extremities, she has classic symptoms of a concussion but no loss of consciousness and a seemingly minor injury.  She does not need advanced neuroimaging, she will be given an anti-inflammatory and nausea medicines and prescriptions for home for the same.  The patient is agreeable, she is stable for discharge        Final Clinical Impression(s) / ED Diagnoses Final diagnoses:  Concussion without loss of consciousness, initial encounter    Rx / DC Orders ED Discharge Orders          Ordered    naproxen (NAPROSYN) 500 MG tablet  2 times daily with meals        07/25/22 1853    ondansetron (ZOFRAN-ODT) 4 MG disintegrating tablet  Every 8 hours PRN        07/25/22 1853              Eber Hong, MD 07/25/22 [phone]

## 2022-07-25 NOTE — ED Triage Notes (Signed)
Pt reports headache since lats night after she hit his head with a side table; reports she loose focus sometime since this morning. Pt has not taken any meds for headache.

## 2022-07-25 NOTE — Discharge Instructions (Addendum)
Go to the emergency department for further evaluation

## 2022-07-25 NOTE — Discharge Instructions (Addendum)
You likely have a concussion, this can cause symptoms for up to 2 weeks including headache nausea sensitivity to light dizziness and sleepiness.  Please rest for the next several days and use the medications like Naprosyn for pain and Zofran for nausea as indicated.  I have sent these prescriptions to the pharmacy.  Do not drive a car until you are back to yourself without any symptoms.  Thank you for allowing [place] to treat you in the emergency department today.  After reviewing your examination and potential testing that was done it appears that you are safe to go home.  I would like for you to follow-up with your doctor within the next several days, have them obtain your results and follow-up with them to review all of these tests.  If you should develop severe or worsening symptoms return to the emergency department immediately

## 2022-10-13 ENCOUNTER — Ambulatory Visit
Admission: EM | Admit: 2022-10-13 | Discharge: 2022-10-13 | Disposition: A | Discharge status: Home / Self Care | Payer: Medicaid Other | Attending: Nurse Practitioner | Admitting: Nurse Practitioner

## 2022-10-13 DIAGNOSIS — R519 Headache, unspecified: Secondary | ICD-10-CM

## 2022-10-13 MED ORDER — PROMETHAZINE HCL 12.5 MG PO TABS: 12.5000 mg | ORAL_TABLET | Freq: Three times a day (TID) | ORAL | 0 refills | Status: AC | PRN

## 2022-10-13 MED ORDER — KETOROLAC TROMETHAMINE 30 MG/ML IJ SOLN
30.0000 mg | Freq: Once | INTRAMUSCULAR | Status: AC
  Administered 2022-10-13: 30 mg via INTRAMUSCULAR

## 2022-10-13 NOTE — Discharge Instructions (Signed)
Take medication as prescribed. Increase fluids and allow for plenty of rest.  Recommend using Pedialyte or Gatorade lites to help with hydration. Recommend sleeping in a dimly lit room while headache symptoms persist. Also try to avoid loud noises while headache symptoms persist. Recommend a brat diet, this includes bananas, rice, applesauce, and toast while symptoms persist. If you develop worsening headache, blurred vision, dizziness, lightheadedness, or other concerns, please follow-up in the emergency department for further evaluation. As discussed, if headache symptoms become more frequent, recommend that you follow-up with your primary care physician for further evaluation. Follow-up as needed.

## 2022-10-13 NOTE — ED Provider Notes (Signed)
[facility]    CSN: [id number] Arrival date & time: 10/13/22  1557      History   Chief Complaint Chief Complaint  Patient presents with   Headache    HPI Ruth Snyder is a 22 y.o. female.   The history is provided by the patient.   The patient presents for complaints of headache with nausea and vomiting.  She reports symptoms have been present for the past 3 days.  Patient reports history of migraine headaches, she reports her last headache was approximately 2 weeks ago.  She states at that time, her entire family was sick with a gastrointestinal illness.  She states that this morning, she woke up with a "pounding headache".  She denies fever, chills, sore throat, ear pain, chest pain, abdominal pain, diarrhea, or constipation.  Patient also complains of photophobia.  She states that she normally takes Excedrin migraine which normally helps her symptoms, but states this is not helping her today.  She also states that she has been taking Tylenol.  Patient states that she vomited while waiting in the lobby for this appointment.  Patient states that Phenergan is the only thing that helps her nausea at this time.   Past Medical History:  Diagnosis Date   GERD (gastroesophageal reflux disease)    Migraine    Speech delay     Patient Active Problem List   Diagnosis Date Noted   Migraine    Viral syndrome 02/23/2013   Dermatitis 02/23/2013   Speech delay     History reviewed. No pertinent surgical history.  OB History   No obstetric history on file.      Home Medications    Prior to Admission medications   Medication Sig Start Date End Date Taking? Authorizing Provider  promethazine (PHENERGAN) 12.5 MG tablet Take 1 tablet (12.5 mg total) by mouth every 8 (eight) hours as needed for nausea or vomiting. 10/13/22  Yes Tenesia Escudero-Warren, Alda Lea, NP  acetaminophen (TYLENOL) 500 MG tablet Take 2 tablets (1,000 mg total) by mouth every 6 (six) hours as needed.  03/21/22   Talbot Grumbling, FNP  meloxicam (MOBIC) 15 MG tablet Take 1 tablet (15 mg total) by mouth daily. 12/26/21   Susy Frizzle, MD  naproxen (NAPROSYN) 500 MG tablet Take 1 tablet (500 mg total) by mouth 2 (two) times daily with a meal. 07/25/22   Noemi Chapel, MD  ondansetron (ZOFRAN-ODT) 4 MG disintegrating tablet Take 1 tablet (4 mg total) by mouth every 8 (eight) hours as needed for nausea. 07/25/22   Noemi Chapel, MD    Family History History reviewed. No pertinent family history.  Social History Social History   Tobacco Use   Smoking status: Never    Passive exposure: Yes   Smokeless tobacco: Never  Vaping Use   Vaping Use: Never used  Substance Use Topics   Alcohol use: Never   Drug use: Never     Allergies   Patient has no known allergies.   Review of Systems Review of Systems Per HPI  Physical Exam Triage Vital Signs ED Triage Vitals  Enc Vitals Group     BP 10/13/22 1728 118/78     Pulse Rate 10/13/22 1728 (!) 129     Resp 10/13/22 1728 16     Temp 10/13/22 1728 98.3 F (36.8 C)     Temp Source 10/13/22 1728 Oral     SpO2 --      Weight --  Height --      Head Circumference --      Peak Flow --      Pain Score 10/13/22 1732 10     Pain Loc --      Pain Edu? --      Excl. in [place]? --    No data found.  Updated Vital Signs BP 118/78 (BP Location: Right Arm)   Pulse (!) 129   Temp 98.3 F (36.8 C) (Oral)   Resp 16   LMP  (Within Months) Comment: 1 month  Visual Acuity Right Eye Distance:   Left Eye Distance:   Bilateral Distance:    Right Eye Near:   Left Eye Near:    Bilateral Near:     Physical Exam Vitals and nursing note reviewed.  Constitutional:      General: She is not in acute distress.    Appearance: She is well-developed.  HENT:     Head: Normocephalic and atraumatic.     Right Ear: Tympanic membrane, ear canal and external ear normal.     Left Ear: Tympanic membrane, ear canal and external ear normal.      Nose: Nose normal.     Mouth/Throat:     Mouth: Mucous membranes are moist.     Pharynx: No oropharyngeal exudate or posterior oropharyngeal erythema.  Eyes:     Extraocular Movements: Extraocular movements intact.     Conjunctiva/sclera: Conjunctivae normal.     Pupils: Pupils are equal, round, and reactive to light.  Cardiovascular:     Rate and Rhythm: Normal rate and regular rhythm.     Heart sounds: No murmur heard. Pulmonary:     Effort: Pulmonary effort is normal. No respiratory distress.     Breath sounds: Normal breath sounds.  Abdominal:     General: Bowel sounds are normal.     Palpations: Abdomen is soft.     Tenderness: There is no abdominal tenderness.  Musculoskeletal:        General: No swelling.     Cervical back: Normal range of motion.  Lymphadenopathy:     Cervical: No cervical adenopathy.  Skin:    General: Skin is warm and dry.     Capillary Refill: Capillary refill takes less than 2 seconds.  Neurological:     General: No focal deficit present.     Mental Status: She is alert and oriented to person, place, and time.     GCS: GCS eye subscore is 4. GCS verbal subscore is 5. GCS motor subscore is 6.     Cranial Nerves: No cranial nerve deficit.     Sensory: No sensory deficit.     Coordination: Coordination normal.     Gait: Gait normal.  Psychiatric:        Mood and Affect: Mood normal.        Speech: Speech normal.        Behavior: Behavior normal.      UC Treatments / Results  Labs (all labs ordered are listed, but only abnormal results are displayed) Labs Reviewed - No data to display  EKG   Radiology No results found.  Procedures Procedures (including critical care time)  Medications Ordered in UC Medications  ketorolac (TORADOL) 30 MG/ML injection 30 mg (30 mg Intramuscular Given 10/13/22 1743)    Initial Impression / Assessment and Plan / UC Course  I have reviewed the triage vital signs and the nursing notes.  Pertinent  labs & imaging results that were  available during my care of the patient were reviewed by me and considered in my medical decision making (see chart for details).  The patient is well-appearing, she is in no acute distress, she is mildly tachycardic at 129, it is likely due to recent nausea and vomiting which may be a result of dehydration.  Symptoms consistent with migraine headache.  She was administered Toradol 30 mg IM in the clinic, we will plan provide the patient with a prescription for Phenergan 12.5 mg tablets for her nausea.  Advised patient that this will be a limited dose.  Patient was in agreement.  Supportive care recommendations were provided to the patient to include use of Pedialyte or Gatorade lites to help with rehydration, recommend a brat diet, and continuing Tylenol or Excedrin Migraine for her headache pain.  Patient was given strict ER precautions.  Patient verbalizes understanding.  All questions were answered.  Patient stable for discharge.   Final Clinical Impressions(s) / UC Diagnoses   Final diagnoses:  Nonintractable headache, unspecified chronicity pattern, unspecified headache type     Discharge Instructions      Take medication as prescribed. Increase fluids and allow for plenty of rest.  Recommend using Pedialyte or Gatorade lites to help with hydration. Recommend sleeping in a dimly lit room while headache symptoms persist. Also try to avoid loud noises while headache symptoms persist. Recommend a brat diet, this includes bananas, rice, applesauce, and toast while symptoms persist. If you develop worsening headache, blurred vision, dizziness, lightheadedness, or other concerns, please follow-up in the emergency department for further evaluation. As discussed, if headache symptoms become more frequent, recommend that you follow-up with your primary care physician for further evaluation. Follow-up as needed.     ED Prescriptions     Medication Sig  Dispense Auth. Provider   promethazine (PHENERGAN) 12.5 MG tablet Take 1 tablet (12.5 mg total) by mouth every 8 (eight) hours as needed for nausea or vomiting. 6 tablet Rosamary Boudreau-Warren, Alda Lea, NP      PDMP not reviewed this encounter.   Tish Men, NP 10/13/22 1752

## 2022-10-13 NOTE — ED Triage Notes (Signed)
Pt reports headache, nausea, vomiting.  x 3 days. Tylenol gives no relief.   Reports Zofran do not gives relief for nausea. Phenergan gives relief.

## 2022-12-17 ENCOUNTER — Ambulatory Visit
Admission: EM | Admit: 2022-12-17 | Discharge: 2022-12-17 | Disposition: A | Discharge status: Home / Self Care | Payer: Medicaid Other | Attending: Nurse Practitioner | Admitting: Nurse Practitioner

## 2022-12-17 DIAGNOSIS — L02419 Cutaneous abscess of limb, unspecified: Secondary | ICD-10-CM

## 2022-12-17 MED ORDER — CEPHALEXIN 500 MG PO CAPS: 500.0000 mg | ORAL_CAPSULE | Freq: Three times a day (TID) | ORAL | 0 refills | Status: AC

## 2022-12-17 NOTE — ED Provider Notes (Signed)
[facility]    CSN: [id number] Arrival date & time: 12/17/22  1709      History   Chief Complaint No chief complaint on file.   HPI Ruth Snyder is a 22 y.o. female.   The history is provided by the patient.   The patient presents with a red painful bump under her right underarm and under the ledt arm that started over the past several days.  Patient states the area under the right arm is larger, more red and swollen compared to the left.. She has tried warm compresses with no relief of symptoms. Denies fever, drainage, bleeding, injury to the area.  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  Past Medical History:  Diagnosis Date   GERD (gastroesophageal reflux disease)    Migraine    Speech delay     Patient Active Problem List   Diagnosis Date Noted   Migraine    Viral syndrome 02/23/2013   Dermatitis 02/23/2013   Speech delay     History reviewed. No pertinent surgical history.  OB History   No obstetric history on file.      Home Medications    Prior to Admission medications   Medication Sig Start Date End Date Taking? Authorizing Provider  acetaminophen (TYLENOL) 500 MG tablet Take 2 tablets (1,000 mg total) by mouth every 6 (six) hours as needed. 03/21/22   Carlisle Beers, FNP  cephALEXin (KEFLEX) 500 MG capsule Take 1 capsule (500 mg total) by mouth 3 (three) times daily for 5 days. 12/17/22 12/22/22 Yes Lavren Lewan-Warren, Sadie Haber, NP  meloxicam (MOBIC) 15 MG tablet Take 1 tablet (15 mg total) by mouth daily. 12/26/21   Donita Brooks, MD  naproxen (NAPROSYN) 500 MG tablet Take 1 tablet (500 mg total) by mouth 2 (two) times daily with a meal. 07/25/22   Eber Hong, MD  ondansetron (ZOFRAN-ODT) 4 MG disintegrating tablet Take 1 tablet (4 mg total) by mouth every 8 (eight) hours as needed for nausea. 07/25/22   Eber Hong, MD  promethazine (PHENERGAN) 12.5 MG tablet Take 1 tablet (12.5 mg total) by mouth every 8 (eight) hours as  needed for nausea or vomiting. 10/13/22   Radin Raptis-Warren, Sadie Haber, NP    Family History History reviewed. No pertinent family history.  Social History Social History   Tobacco Use   Smoking status: Never    Passive exposure: Yes   Smokeless tobacco: Never  Vaping Use   Vaping Use: Never used  Substance Use Topics   Alcohol use: Never   Drug use: Never     Allergies   Patient has no known allergies.   Review of Systems Review of Systems Per HPI  Physical Exam Triage Vital Signs ED Triage Vitals  Enc Vitals Group     BP 12/17/22 1717 117/85     Pulse Rate 12/17/22 1717 76     Resp 12/17/22 1717 15     Temp 12/17/22 1717 98.9 F (37.2 C)     Temp Source 12/17/22 1717 Oral     SpO2 12/17/22 1717 95 %     Weight --      Height --      Head Circumference --      Peak Flow --      Pain Score 12/17/22 1718 7     Pain Loc --      Pain Edu? --      Excl. in [place]? --    No data found.  Updated Vital Signs BP 117/85 (BP Location: Right Arm)   Pulse 76   Temp 98.9 F (37.2 C) (Oral)   Resp 15   LMP 11/25/2022 (Within Days)   SpO2 95%   Visual Acuity Right Eye Distance:   Left Eye Distance:   Bilateral Distance:    Right Eye Near:   Left Eye Near:    Bilateral Near:     Physical Exam Vitals and nursing note reviewed.  Constitutional:      General: She is not in acute distress.    Appearance: Normal appearance.  HENT:     Head: Normocephalic.  Eyes:     Extraocular Movements: Extraocular movements intact.     Pupils: Pupils are equal, round, and reactive to light.  Pulmonary:     Effort: Pulmonary effort is normal.  Musculoskeletal:     Cervical back: Normal range of motion.  Lymphadenopathy:     Cervical: No cervical adenopathy.  Skin:    General: Skin is warm and dry.     Comments: Erythematous and tender induration noted to the right maxilla.  Area is nonfluctuant, there is no oozing, drainage, bleeding.  Area measures approximately 1 cm in  diameter.  Erythematous and tender induration noted to to the left axilla.  Area measures approximately 0.5 cm in diameter.. There is no o leakage ozing, drainage, or bleeding.  Neurological:     General: No focal deficit present.     Mental Status: She is alert and oriented to person, place, and time.  Psychiatric:        Mood and Affect: Mood normal.        Behavior: Behavior normal.      UC Treatments / Results  Labs (all labs ordered are listed, but only abnormal results are displayed) Labs Reviewed - No data to display  EKG   Radiology No results found.  Procedures Procedures (including critical care time)  Medications Ordered in UC Medications - No data to display  Initial Impression / Assessment and Plan / UC Course  I have reviewed the triage vital signs and the nursing notes.  Pertinent labs & imaging results that were available during my care of the patient were reviewed by me and considered in my medical decision making (see chart for details).  The patient is well-appearing, she is in no acute distress, vital signs are stable.  Treatment.  For skin abscess with Keflex 500 mg 3 times daily for the next 5 days.  Supportive care recommendations were provided and discussed with the patient to include use of warm compresses, over-the-counter analgesics for pain or discomfort, and cleansing the area with an antibacterial soap.  Patient was given strict ER follow-up precautions.  Patient advised to follow-up with her PCP for reevaluation, also recommended discussing referral to dermatology for recurrent cyst.  Patient is in agreement with this plan of care and verbalizes understanding.  All questions were answered.  Patient stable for discharge.   Final Clinical Impressions(s) / UC Diagnoses   Final diagnoses:  Axillary abscess     Discharge Instructions      Take medication as prescribed. Warm compresses to the affected area 3-4 times daily. Clean the area  at least twice daily with Dial Gold bar soap or Hibiclens soap.  Keep the area covered if it begins draining. Go to the emergency department if you develop fever, chills, generalized fatigue, nausea, vomiting, or if the area of redness spreads into the genital region, or if you have  foul-smelling drainage.  Follow-up with your primary care physician for reevaluation or recheck.  As discussed, recommend speaking with your primary care physician for possible referral to dermatology. Follow-up as needed.     ED Prescriptions     Medication Sig Dispense Auth. Provider   cephALEXin (KEFLEX) 500 MG capsule Take 1 capsule (500 mg total) by mouth 3 (three) times daily for 5 days. 15 capsule Rianne Degraaf-Warren, Sadie Haber, NP      PDMP not reviewed this encounter.   Abran Cantor, NP 12/17/22 1742

## 2022-12-17 NOTE — ED Triage Notes (Signed)
Pt c/o bump under right arm, enlarged, painful x 5 days

## 2022-12-17 NOTE — Discharge Instructions (Addendum)
Take medication as prescribed. Warm compresses to the affected area 3-4 times daily. Clean the area at least twice daily with Dial Gold bar soap or Hibiclens soap.  Keep the area covered if it begins draining. Go to the emergency department if you develop fever, chills, generalized fatigue, nausea, vomiting, or if the area of redness spreads into the genital region, or if you have foul-smelling drainage.  Follow-up with your primary care physician for reevaluation or recheck.  As discussed, recommend speaking with your primary care physician for possible referral to dermatology. Follow-up as needed.

## 2023-01-01 IMAGING — DX DG KNEE COMPLETE 4+V*L*
4 series · 4 of 4 positions shown · non-contrast
Comparison: None.

CLINICAL DATA: Twisted left knee and felt a pop 3 times. Injury was
3 days ago.

EXAM:
LEFT KNEE - COMPLETE 4+ VIEW

[knee ap]
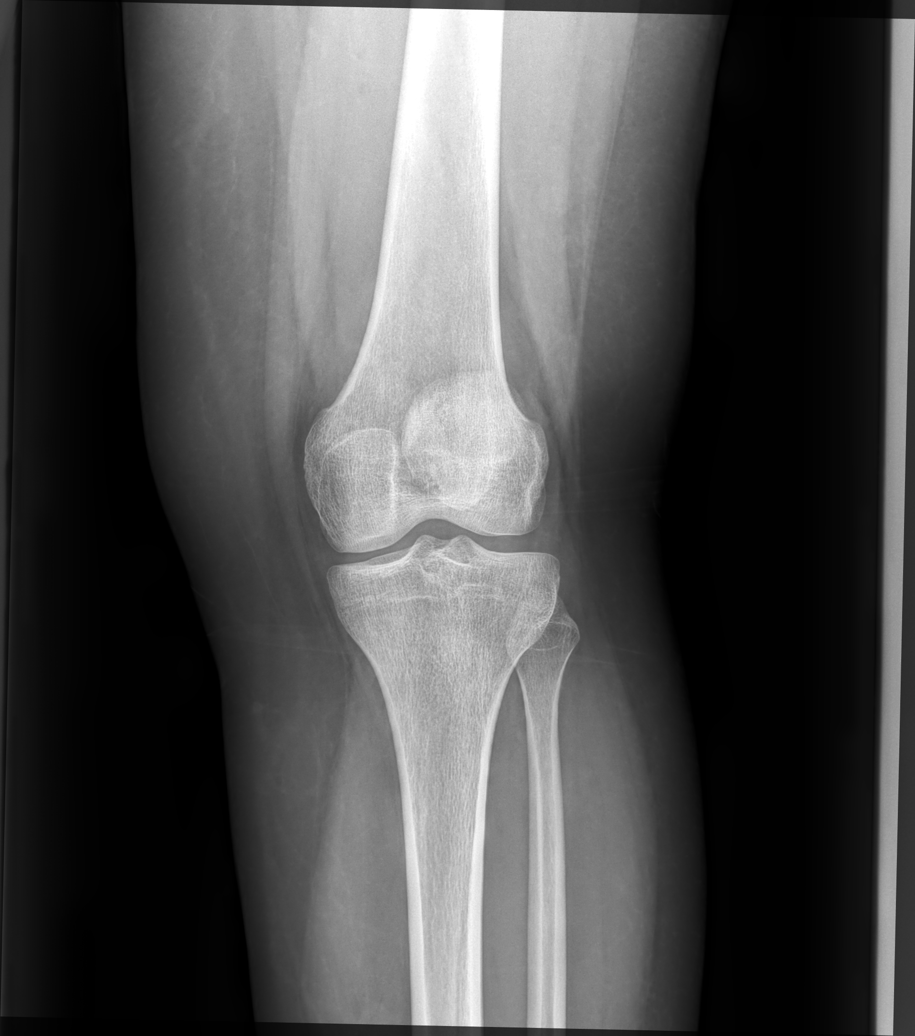

[knee mlo (1 of 2)]
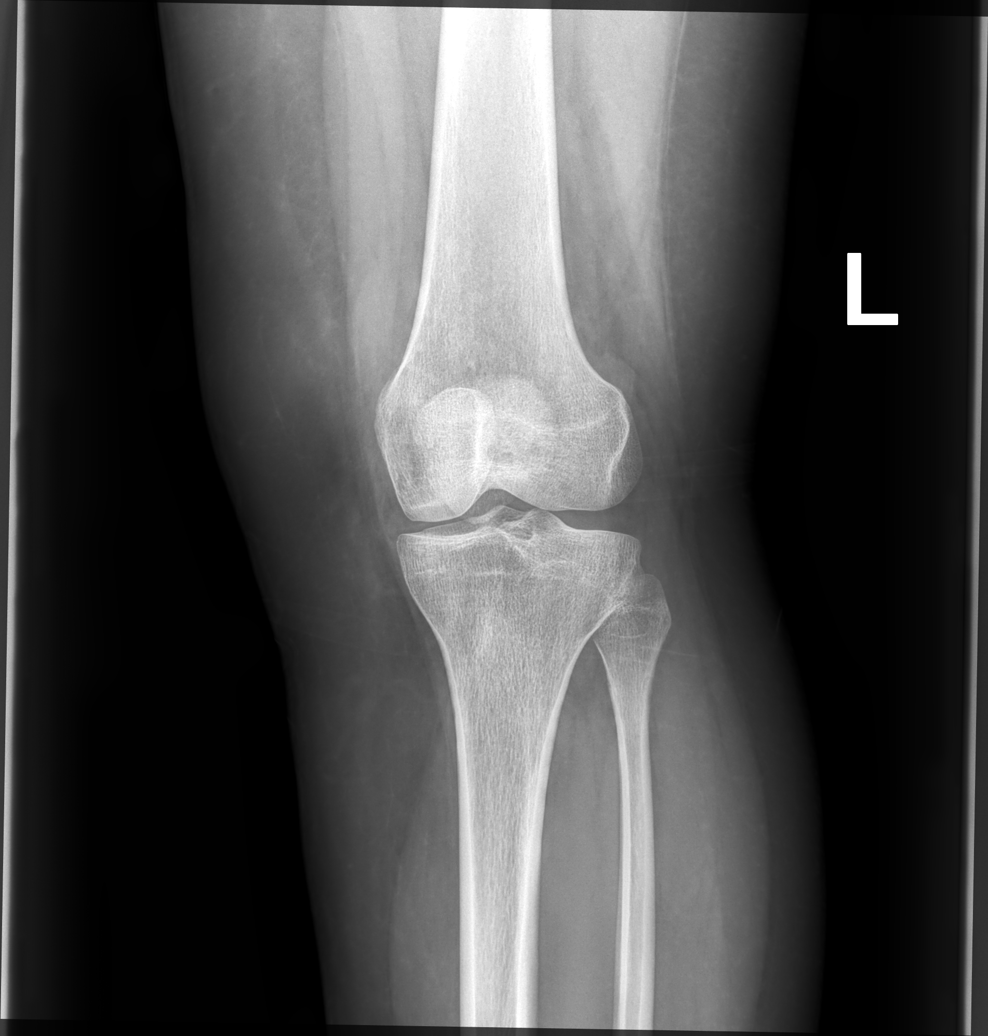

[knee mlo (2 of 2)]
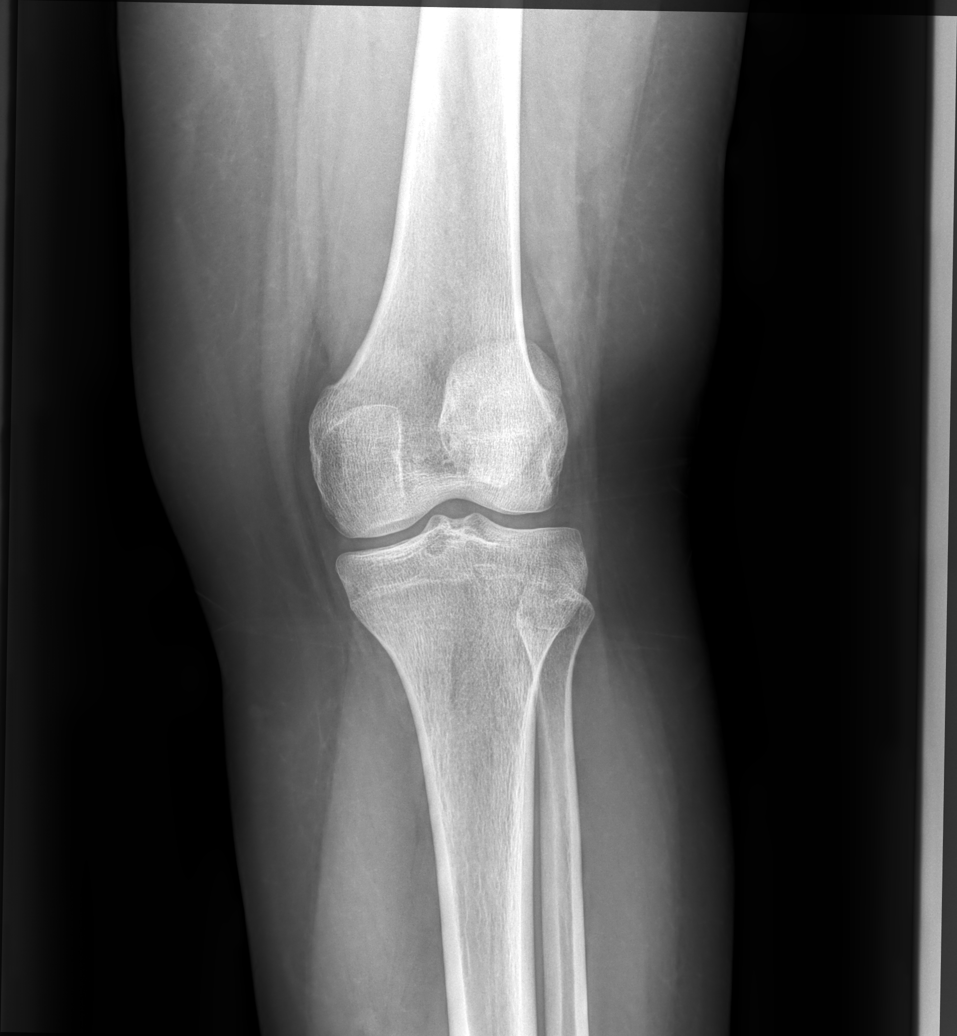

[knee lat]
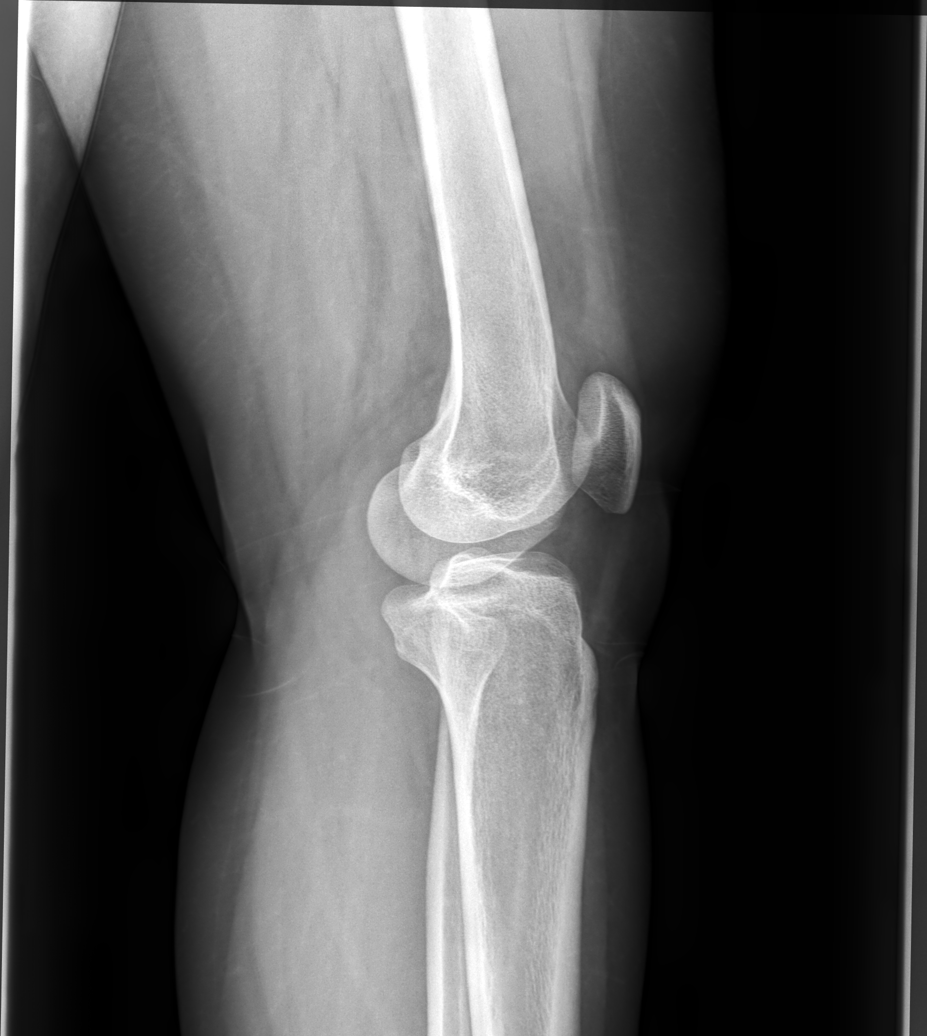

[4 of 4 positions shown; findings below may reference images not displayed]

FINDINGS: Normal bone mineralization. Joint spaces are preserved. No joint
effusion. No acute fracture or dislocation.
IMPRESSION: No acute abnormality.

## 2023-02-13 ENCOUNTER — Telehealth: Payer: Self-pay

## 2023-02-13 NOTE — Telephone Encounter (Signed)
LVM for patient to call back [phone], or to call PCP office to schedule follow up apt. AS, CMA  

## 2023-04-01 ENCOUNTER — Other Ambulatory Visit: Payer: Self-pay

## 2023-04-01 ENCOUNTER — Emergency Department (HOSPITAL_COMMUNITY): Payer: Medicaid Other

## 2023-04-01 ENCOUNTER — Encounter (HOSPITAL_COMMUNITY): Payer: Self-pay

## 2023-04-01 ENCOUNTER — Emergency Department (HOSPITAL_COMMUNITY)
Admission: EM | Admit: 2023-04-01 | Discharge: 2023-04-02 | Disposition: A | Discharge status: Home / Self Care | Payer: Medicaid Other | Attending: Emergency Medicine | Admitting: Emergency Medicine

## 2023-04-01 DIAGNOSIS — K219 Gastro-esophageal reflux disease without esophagitis: Secondary | ICD-10-CM | POA: Insufficient documentation

## 2023-04-01 DIAGNOSIS — R109 Unspecified abdominal pain: Secondary | ICD-10-CM | POA: Diagnosis not present

## 2023-04-01 DIAGNOSIS — R7401 Elevation of levels of liver transaminase levels: Secondary | ICD-10-CM | POA: Diagnosis not present

## 2023-04-01 LAB — URINALYSIS, ROUTINE W REFLEX MICROSCOPIC
Bilirubin Urine: NEGATIVE
Glucose, UA: NEGATIVE mg/dL
Ketones, ur: NEGATIVE mg/dL
Leukocytes,Ua: NEGATIVE
Nitrite: NEGATIVE
Protein, ur: 30 mg/dL — AB
Specific Gravity, Urine: 1.018 (ref 1.005–1.030)
pH: 5 (ref 5.0–8.0)

## 2023-04-01 LAB — COMPREHENSIVE METABOLIC PANEL
ALT: 230 U/L — ABNORMAL HIGH (ref 0–44)
AST: 160 U/L — ABNORMAL HIGH (ref 15–41)
Albumin: 4 g/dL (ref 3.5–5.0)
Alkaline Phosphatase: 171 U/L — ABNORMAL HIGH (ref 38–126)
Anion gap: 9 (ref 5–15)
BUN: 7 mg/dL (ref 6–20)
CO2: 25 mmol/L (ref 22–32)
Calcium: 8.7 mg/dL — ABNORMAL LOW (ref 8.9–10.3)
Chloride: 102 mmol/L (ref 98–111)
Creatinine, Ser: 0.69 mg/dL (ref 0.44–1.00)
GFR, Estimated: >60 mL/min (ref >60)
Glucose, Bld: 98 mg/dL (ref 70–99)
Potassium: 3.8 mmol/L (ref 3.5–5.1)
Sodium: 136 mmol/L (ref 135–145)
Total Bilirubin: 1.2 mg/dL (ref 0.3–1.2)
Total Protein: 7.3 g/dL (ref 6.5–8.1)

## 2023-04-01 LAB — CBC WITH DIFFERENTIAL/PLATELET
Abs Immature Granulocytes: 0 10*3/uL (ref 0.00–0.07)
Band Neutrophils: 2 %
Basophils Absolute: 0 10*3/uL (ref 0.0–0.1)
Basophils Relative: 1 %
Eosinophils Absolute: 0 10*3/uL (ref 0.0–0.5)
Eosinophils Relative: 0 %
HCT: 38.7 % (ref 36.0–46.0)
Hemoglobin: 13 g/dL (ref 12.0–15.0)
Lymphocytes Relative: 43 %
Lymphs Abs: 2 10*3/uL (ref 0.7–4.0)
MCH: 30 pg (ref 26.0–34.0)
MCHC: 33.6 g/dL (ref 30.0–36.0)
MCV: 89.4 fL (ref 80.0–100.0)
Monocytes Absolute: 0.6 10*3/uL (ref 0.1–1.0)
Monocytes Relative: 12 %
Neutro Abs: 2.1 10*3/uL (ref 1.7–7.7)
Neutrophils Relative %: 42 %
Platelets: 132 10*3/uL — ABNORMAL LOW (ref 150–400)
RBC: 4.33 MIL/uL (ref 3.87–5.11)
RDW: 12.2 % (ref 11.5–15.5)
WBC Morphology: >10
WBC: 4.7 10*3/uL (ref 4.0–10.5)
nRBC: 0 % (ref 0.0–0.2)

## 2023-04-01 LAB — ACETAMINOPHEN LEVEL: Acetaminophen (Tylenol), Serum: <10 ug/mL — ABNORMAL LOW (ref 10–30)

## 2023-04-01 LAB — POC URINE PREG, ED: Preg Test, Ur: NEGATIVE

## 2023-04-01 LAB — LIPASE, BLOOD: Lipase: 29 U/L (ref 11–51)

## 2023-04-01 MED ORDER — KETOROLAC TROMETHAMINE 15 MG/ML IJ SOLN
15.0000 mg | Freq: Once | INTRAMUSCULAR | Status: AC
Start: 2023-04-01 — End: 2023-04-01
  Administered 2023-04-01: 15 mg via INTRAVENOUS
  Filled 2023-04-01: qty 1

## 2023-04-01 MED ORDER — SODIUM CHLORIDE 0.9 % IV BOLUS
1000.0000 mL | Freq: Once | INTRAVENOUS | Status: AC
  Administered 2023-04-01: 1000 mL via INTRAVENOUS

## 2023-04-01 NOTE — ED Triage Notes (Signed)
Pt states lower back pain starting yesterday and progressively getting worse. Pt has been sick the past week and noticed dark urine.

## 2023-04-01 NOTE — ED Provider Notes (Signed)
[place] EMER[facility]ENC[facility] DEPARTMENT AT [facility] Provider Note   CSN: [id number] Arrival date & time: 04/01/23  1701     History {Add pertinent medical, surgical, social history, OB history to HPI:1} Chief Complaint  Patient presents with  . Flank Pain    Ruth Snyder is a 22 y.o. female.  Patient with history of GERD, migraines presents today with complaints of left flank pain. She states that same began yesterday and has been persistent since then. States that about a week ago she had a migraine which was similar in nature to her chronic migraine symptoms. She went to urgent care and was given a medication to stop her migraine. She is not sure what this medication was. She was given a prescription for 2 medications to go home with as well and is not sure what these medications were either. She states that they stopped her headaches. She had an interval of nausea and vomiting a few days ago that resolved without intervention and has not had any vomiting since then. She states that she is currently having pain only in her left flank. She is currently on her menstrual cycle which is the right time for her. She is not having any vaginal discharge and does not have any concerns for STI. She has not been taking any tylenol recently. She does not drink alcohol and denies any recreational drug use of IVDU. Denies any history of similar symptoms previously. No sick contacts.   The history is provided by the patient. No language interpreter was used.  Flank Pain      Home Medications Prior to Admission medications   Medication Sig Start Date End Date Taking? Authorizing Provider  acetaminophen (TYLENOL) 500 MG tablet Take 2 tablets (1,000 mg total) by mouth every 6 (six) hours as needed. 03/21/22   Carlisle Beers, FNP  meloxicam (MOBIC) 15 MG tablet Take 1 tablet (15 mg total) by mouth daily. 12/26/21   Donita Brooks, MD  naproxen (NAPROSYN) 500 MG tablet Take 1 tablet  (500 mg total) by mouth 2 (two) times daily with a meal. 07/25/22   Eber Hong, MD  ondansetron (ZOFRAN-ODT) 4 MG disintegrating tablet Take 1 tablet (4 mg total) by mouth every 8 (eight) hours as needed for nausea. 07/25/22   Eber Hong, MD  promethazine (PHENERGAN) 12.5 MG tablet Take 1 tablet (12.5 mg total) by mouth every 8 (eight) hours as needed for nausea or vomiting. 10/13/22   Leath-Warren, Sadie Haber, NP      Allergies    Patient has no known allergies.    Review of Systems   Review of Systems  Genitourinary:  Positive for flank pain.  All other systems reviewed and are negative.   Physical Exam Updated Vital Signs BP 131/80   Pulse 89   Temp 98.9 F (37.2 C) (Oral)   Resp 17   Ht 5\' 4"  (1.626 m)   Wt 85.8 kg   LMP 03/29/2023   SpO2 96%   BMI 32.48 kg/m  Physical Exam Vitals and nursing note reviewed.  Constitutional:      General: She is not in acute distress.    Appearance: Normal appearance. She is normal weight. She is not ill-appearing, toxic-appearing or diaphoretic.  HENT:     Head: Normocephalic and atraumatic.  Cardiovascular:     Rate and Rhythm: Normal rate.  Pulmonary:     Effort: Pulmonary effort is normal. No respiratory distress.  Abdominal:     General:  Abdomen is flat.     Palpations: Abdomen is soft.     Tenderness: There is no abdominal tenderness. There is left CVA tenderness. There is no right CVA tenderness.  Musculoskeletal:        General: Normal range of motion.     Cervical back: Normal range of motion.  Skin:    General: Skin is warm and dry.  Neurological:     General: No focal deficit present.     Mental Status: She is alert.  Psychiatric:        Mood and Affect: Mood normal.        Behavior: Behavior normal.    ED Results / Procedures / Treatments   Labs (all labs ordered are listed, but only abnormal results are displayed) Labs Reviewed  URINALYSIS, ROUTINE W REFLEX MICROSCOPIC - Abnormal; Notable for the  following components:      Result Value   Color, Urine AMBER (*)    APPearance HAZY (*)    Hgb urine dipstick LARGE (*)    Protein, ur 30 (*)    Bacteria, UA RARE (*)    All other components within normal limits  COMPREHENSIVE METABOLIC PANEL - Abnormal; Notable for the following components:   Calcium 8.7 (*)    AST 160 (*)    ALT 230 (*)    Alkaline Phosphatase 171 (*)    All other components within normal limits  CBC WITH DIFFERENTIAL/PLATELET - Abnormal; Notable for the following components:   Platelets 132 (*)    All other components within normal limits  ACETAMINOPHEN LEVEL - Abnormal; Notable for the following components:   Acetaminophen (Tylenol), Serum <10 (*)    All other components within normal limits  LIPASE, BLOOD  HEPATITIS PANEL, ACUTE  POC URINE PREG, ED    EKG None  Radiology CT Renal Stone Study  Result Date: 04/01/2023 CLINICAL DATA:  Abdominal/flank pain, stone suspected EXAM: CT ABDOMEN AND PELVIS WITHOUT CONTRAST TECHNIQUE: Multidetector CT imaging of the abdomen and pelvis was performed following the standard protocol without IV contrast. RADIATION DOSE REDUCTION: This exam was performed according to the departmental dose-optimization program which includes automated exposure control, adjustment of the mA and/or kV according to patient size and/or use of iterative reconstruction technique. COMPARISON:  None Available. FINDINGS: Lower chest: No pleural or pericardial effusion. Visualized lung bases clear. Hepatobiliary: No focal liver abnormality is seen. No gallstones, gallbladder wall thickening, or biliary dilatation. Pancreas: Unremarkable. No pancreatic ductal dilatation or surrounding inflammatory changes. Spleen: Prominent spleen, 3.6 cm craniocaudal length, without focal lesion. Adrenals/Urinary Tract: Adrenal glands are unremarkable. Kidneys are normal, without renal calculi, focal lesion, or hydronephrosis. Bladder is unremarkable. Stomach/Bowel: Stomach  partially distended, without acute finding. Small bowel decompressed. Normal appendix. Colon partially distended, unremarkable. Vascular/Lymphatic: No significant vascular findings are present. No enlarged abdominal or pelvic lymph nodes. Reproductive: Uterus and bilateral adnexa are unremarkable. Other: Trace pelvic ascites, typically physiologic in menstruating females. No free air. Musculoskeletal: No acute or significant osseous findings. IMPRESSION: 1. No acute findings. Electronically Signed   By: Corlis Leak M.D.   On: 04/01/2023 21:53    Procedures Procedures  {Document cardiac monitor, telemetry assessment procedure when appropriate:1}  Medications Ordered in ED Medications  ketorolac (TORADOL) 15 MG/ML injection 15 mg (15 mg Intravenous Given 04/01/23 1928)  sodium chloride 0.9 % bolus 1,000 mL (0 mLs Intravenous Stopped 04/01/23 2023)    ED Course/ Medical Decision Making/ A&P   {   Click here for ABCD2,  HEART and other calculatorsREFRESH Note before signing :1}                              Medical Decision Making Amount and/or Complexity of Data Reviewed Labs: ordered. Radiology: ordered.  Risk Prescription drug management.   ***  {Document critical care time when appropriate:1} {Document review of labs and clinical decision tools ie heart score, Chads2Vasc2 etc:1}  {Document your independent review of radiology images, and any outside records:1} {Document your discussion with family members, caretakers, and with consultants:1} {Document social determinants of health affecting pt's care:1} {Document your decision making why or why not admission, treatments were needed:1} Final Clinical Impression(s) / ED Diagnoses Final diagnoses:  None    Rx / DC Orders ED Discharge Orders     None

## 2023-04-01 NOTE — ED Notes (Signed)
Patient transported to CT 

## 2023-04-01 NOTE — ED Notes (Signed)
Pt ambulated to the bathroom.  

## 2023-04-02 LAB — MONONUCLEOSIS SCREEN: Mono Screen: POSITIVE — AB

## 2023-04-02 NOTE — Discharge Instructions (Signed)
As we discussed, your work-up in the ER was reassuring for acute findings. It did show some elevation in your liver enzymes which could be occurring for several different reasons. Given you have no pain in this area (right upper abdomen) I have a low suspicion that this is an emergent etiology. Your CT also showed not abnormalities in your liver or surrounding structures. There are a few labs that are pending at your discharge and could be causing your symptoms. Please monitor your mychart online for these results. Call your primary doctor tomorrow to schedule an appointment for follow-up to ensure that these lab values have normalized.   Return if development of any new or worsening symptoms.

## 2023-04-03 ENCOUNTER — Encounter: Payer: Self-pay | Admitting: Family Medicine

## 2023-04-03 ENCOUNTER — Ambulatory Visit: Payer: Medicaid Other | Admitting: Family Medicine

## 2023-04-03 VITALS — BP 118/62 | HR 105 | Temp 98.4°F | Ht 64.0 in | Wt 188.4 lb

## 2023-04-03 DIAGNOSIS — J302 Other seasonal allergic rhinitis: Secondary | ICD-10-CM | POA: Insufficient documentation

## 2023-04-03 DIAGNOSIS — B2790 Infectious mononucleosis, unspecified without complication: Secondary | ICD-10-CM

## 2023-04-03 NOTE — Progress Notes (Signed)
Subjective:    Patient ID: Ruth Snyder, female    DOB: April 09, 2001, 21 y.o.   MRN: [id number]  HPI Seen in Er 7/31.  LFT's  elevated and mono screen positive.  On CT scan of the abdomen, the patient was found to have splenomegaly.  Liver function tests were elevated significantly.  She is here today for follow-up.  She has been feeling sick for approximately 1 week.  Today she comes with fatigue, sore throat, myalgias, and cough.  She is also having nausea.  On exam, she has erythema in the posterior oropharynx and tenderness to palpation in the right and left upper quadrants.  There is no palpable mass in the left upper quadrant.  However she does have voluntary guarding in that area. Past Medical History:  Diagnosis Date   GERD (gastroesophageal reflux disease)    Migraine    Speech delay    No past surgical history on file. Current Outpatient Medications on File Prior to Visit  Medication Sig Dispense Refill   acetaminophen (TYLENOL) 500 MG tablet Take 2 tablets (1,000 mg total) by mouth every 6 (six) hours as needed. 30 tablet 0   meloxicam (MOBIC) 15 MG tablet Take 1 tablet (15 mg total) by mouth daily. 30 tablet 0   naproxen (NAPROSYN) 500 MG tablet Take 1 tablet (500 mg total) by mouth 2 (two) times daily with a meal. 30 tablet 0   ondansetron (ZOFRAN-ODT) 4 MG disintegrating tablet Take 1 tablet (4 mg total) by mouth every 8 (eight) hours as needed for nausea. 10 tablet 0   promethazine (PHENERGAN) 12.5 MG tablet Take 1 tablet (12.5 mg total) by mouth every 8 (eight) hours as needed for nausea or vomiting. 6 tablet 0   No current facility-administered medications on file prior to visit.   No Known Allergies  Social History   Socioeconomic History   Marital status: Single    Spouse name: Not on file   Number of children: Not on file   Years of education: Not on file   Highest education level: Not on file  Occupational History   Not on file  Tobacco Use   Smoking  status: Never    Passive exposure: Yes   Smokeless tobacco: Never  Vaping Use   Vaping status: Never Used  Substance and Sexual Activity   Alcohol use: Never   Drug use: Never   Sexual activity: Never    Birth control/protection: None  Other Topics Concern   Not on file  Social History Narrative   ** Merged History Encounter **       Social Determinants of Health   Financial Resource Strain: Not on file  Food Insecurity: Not on file  Transportation Needs: Not on file  Physical Activity: Not on file  Stress: Not on file  Social Connections: Not on file  Intimate Partner Violence: Not on file      Review of Systems  All other systems reviewed and are negative.      Objective:   Physical Exam Constitutional:      Appearance: Normal appearance. She is not ill-appearing or toxic-appearing.  HENT:     Right Ear: Tympanic membrane and ear canal normal.     Left Ear: Tympanic membrane and ear canal normal.     Nose: Congestion and rhinorrhea present.     Mouth/Throat:     Pharynx: Posterior oropharyngeal erythema present. No oropharyngeal exudate.  Eyes:     Conjunctiva/sclera: Conjunctivae normal.  Cardiovascular:  Rate and Rhythm: Normal rate and regular rhythm.     Heart sounds: Normal heart sounds. No murmur heard. Pulmonary:     Effort: Pulmonary effort is normal. No respiratory distress.     Breath sounds: Normal breath sounds. No wheezing, rhonchi or rales.  Abdominal:     General: Abdomen is flat. There is no distension.     Tenderness: There is abdominal tenderness. There is guarding. There is no rebound.    Musculoskeletal:     Cervical back: Neck supple.  Lymphadenopathy:     Cervical: Cervical adenopathy present.  Neurological:     Mental Status: She is alert.           Assessment & Plan:  Infectious mononucleosis without complication, infectious mononucleosis due to unspecified organism - Plan: CBC with Differential/Platelet, COMPLETE  METABOLIC PANEL WITH GFR Patient still feels very poorly.  I recommended that she stay out of work through next Tuesday.  Rest would be the best thing for her at this point.  I will check a CBC as well as a CMP.  If there is thrombocytopenia or evidence of bone marrow suppression, may start the patient on glucocorticoids.  Otherwise recommend supportive care.  Discussed the contagious nature of mono and recommended that she keep her distance to avoid potential spread

## 2023-04-04 LAB — CBC WITH DIFFERENTIAL/PLATELET
Absolute Monocytes: 413 {cells}/uL (ref 200–950)
Basophils Absolute: 211 {cells}/uL — ABNORMAL HIGH (ref 0–200)
Basophils Relative: 2.7 %
Eosinophils Absolute: 62 {cells}/uL (ref 15–500)
Eosinophils Relative: 0.8 %
HCT: 41.5 % (ref 35.0–45.0)
Hemoglobin: 13.9 g/dL (ref 11.7–15.5)
Lymphs Abs: 4859 {cells}/uL — ABNORMAL HIGH (ref 850–3900)
MCH: 30.2 pg (ref 27.0–33.0)
MCHC: 33.5 g/dL (ref 32.0–36.0)
MCV: 90.2 fL (ref 80.0–100.0)
MPV: 10.5 fL (ref 7.5–12.5)
Monocytes Relative: 5.3 %
Neutro Abs: 2254 {cells}/uL (ref 1500–7800)
Neutrophils Relative %: 28.9 %
Platelets: 173 Thousand/uL (ref 140–400)
RBC: 4.6 Million/uL (ref 3.80–5.10)
RDW: 12.1 % (ref 11.0–15.0)
Total Lymphocyte: 62.3 %
WBC: 7.8 Thousand/uL (ref 3.8–10.8)

## 2023-04-06 ENCOUNTER — Other Ambulatory Visit: Payer: Self-pay

## 2023-04-06 DIAGNOSIS — B2790 Infectious mononucleosis, unspecified without complication: Secondary | ICD-10-CM

## 2023-04-07 ENCOUNTER — Ambulatory Visit: Payer: Medicaid Other | Admitting: Family Medicine

## 2023-04-07 ENCOUNTER — Telehealth: Payer: Self-pay

## 2023-04-07 VITALS — BP 120/70 | HR 114 | Temp 98.3°F | Ht 64.0 in | Wt 188.0 lb

## 2023-04-07 DIAGNOSIS — J029 Acute pharyngitis, unspecified: Secondary | ICD-10-CM

## 2023-04-07 DIAGNOSIS — B2790 Infectious mononucleosis, unspecified without complication: Secondary | ICD-10-CM

## 2023-04-07 LAB — STREP GROUP A AG, W/REFLEX TO CULT: Streptococcus Group A AG: NOT DETECTED

## 2023-04-07 MED ORDER — PREDNISONE 20 MG PO TABS
ORAL_TABLET | ORAL | 0 refills | Status: DC
Start: 2023-04-07 — End: 2023-05-13

## 2023-04-07 NOTE — Transitions of Care (Post Inpatient/ED Visit) (Signed)
   04/07/2023  Name: Ruth Snyder MRN: [id number] DOB: May 08, 2001  Today's TOC FU Call Status: Today's TOC FU Call Status:: Unsuccessful Call (1st Attempt) Unsuccessful Call (1st Attempt) Date: 04/07/23  Attempted to reach the patient regarding the most recent Inpatient/ED visit.  Follow Up Plan: Additional outreach attempts will be made to reach the patient to complete the Transitions of Care (Post Inpatient/ED visit) call.   Signature Vicente Males, LPN [facility] AWV Team Direct Dial: [phone]

## 2023-04-07 NOTE — Progress Notes (Signed)
Subjective:    Patient ID: Ruth Snyder, female    DOB: Jan 06, 2001, 22 y.o.   MRN: [id number]  HPI 04/03/23 Seen in Er 7/31.  LFT's  elevated and mono screen positive.  On CT scan of the abdomen, the patient was found to have splenomegaly.  Liver function tests were elevated significantly.  She is here today for follow-up.  She has been feeling sick for approximately 1 week.  Today she comes with fatigue, sore throat, myalgias, and cough.  She is also having nausea.  On exam, she has erythema in the posterior oropharynx and tenderness to palpation in the right and left upper quadrants.  There is no palpable mass in the left upper quadrant.  However she does have voluntary guarding in that area.  At that time, my plan was: Patient still feels very poorly.  I recommended that she stay out of work through next Tuesday.  Rest would be the best thing for her at this point.  I will check a CBC as well as a CMP.  If there is thrombocytopenia or evidence of bone marrow suppression, may start the patient on glucocorticoids.  Otherwise recommend supportive care.  Discussed the contagious nature of mono and recommended that she keep her distance to avoid potential spread  04/07/23 Today the patient's tonsils are +3 in size bilaterally.  They are erythematous.  They have white exudate on the left tonsil.  She has tender cervical lymphadenopathy bilaterally.  She reports a sore throat.  She reports pain with swallowing.  She denies any trouble breathing.  She denies any stridor.  She denies any wheezing.  Strep test today is negative.  Liver function tests were still elevated last week.  However fortunately today her abdominal exam has improved.  She has less tenderness to palpation around the liver and the spleen. Past Medical History:  Diagnosis Date   GERD (gastroesophageal reflux disease)    Migraine    Speech delay    No past surgical history on file. Current Outpatient Medications on File Prior to  Visit  Medication Sig Dispense Refill   acetaminophen (TYLENOL) 500 MG tablet Take 2 tablets (1,000 mg total) by mouth every 6 (six) hours as needed. 30 tablet 0   meloxicam (MOBIC) 15 MG tablet Take 1 tablet (15 mg total) by mouth daily. 30 tablet 0   naproxen (NAPROSYN) 500 MG tablet Take 1 tablet (500 mg total) by mouth 2 (two) times daily with a meal. 30 tablet 0   ondansetron (ZOFRAN-ODT) 4 MG disintegrating tablet Take 1 tablet (4 mg total) by mouth every 8 (eight) hours as needed for nausea. 10 tablet 0   promethazine (PHENERGAN) 12.5 MG tablet Take 1 tablet (12.5 mg total) by mouth every 8 (eight) hours as needed for nausea or vomiting. 6 tablet 0   No current facility-administered medications on file prior to visit.   No Known Allergies  Social History   Socioeconomic History   Marital status: Single    Spouse name: Not on file   Number of children: Not on file   Years of education: Not on file   Highest education level: Not on file  Occupational History   Not on file  Tobacco Use   Smoking status: Never    Passive exposure: Yes   Smokeless tobacco: Never  Vaping Use   Vaping status: Never Used  Substance and Sexual Activity   Alcohol use: Never   Drug use: Never   Sexual activity: Never  Birth control/protection: None  Other Topics Concern   Not on file  Social History Narrative   ** Merged History Encounter **       Social Determinants of Health   Financial Resource Strain: Not on file  Food Insecurity: Not on file  Transportation Needs: Not on file  Physical Activity: Not on file  Stress: Not on file  Social Connections: Not on file  Intimate Partner Violence: Not on file      Review of Systems  All other systems reviewed and are negative.      Objective:   Physical Exam Constitutional:      Appearance: Normal appearance. She is not ill-appearing or toxic-appearing.  HENT:     Right Ear: Tympanic membrane and ear canal normal.     Left Ear:  Tympanic membrane and ear canal normal.     Nose: Congestion and rhinorrhea present.     Mouth/Throat:     Pharynx: Oropharyngeal exudate and posterior oropharyngeal erythema present.     Tonsils: Tonsillar exudate present. 3+ on the right. 3+ on the left.  Eyes:     Conjunctiva/sclera: Conjunctivae normal.  Cardiovascular:     Rate and Rhythm: Normal rate and regular rhythm.     Heart sounds: Normal heart sounds. No murmur heard. Pulmonary:     Effort: Pulmonary effort is normal. No respiratory distress.     Breath sounds: Normal breath sounds. No wheezing, rhonchi or rales.  Abdominal:     General: Abdomen is flat. There is no distension.     Tenderness: There is abdominal tenderness. There is guarding. There is no rebound.    Musculoskeletal:     Cervical back: Neck supple.  Lymphadenopathy:     Cervical: Cervical adenopathy present.  Neurological:     Mental Status: She is alert.           Assessment & Plan:  Infectious mononucleosis without complication, infectious mononucleosis due to unspecified organism - Plan: STREP GROUP A AG, W/REFLEX TO CULT  Sore throat - Plan: STREP GROUP A AG, W/REFLEX TO CULT Patient has mononucleosis.  She has significant swelling in her tonsils with exudate.  Strep test is negative.  Start the patient on a prednisone taper pack to try to reduce the oropharyngeal swelling.  Discussed with the patient may need to go to the emergency room if she develops any trouble breathing or stridor.  Today there is no evidence of a compromised airway.  Symptoms primarily are swelling of the tonsils and pain with swallowing.  Hopefully this will improve with prednisone.  I recommended the patient stay out of work the rest of the week.

## 2023-04-08 NOTE — Transitions of Care (Post Inpatient/ED Visit) (Signed)
   04/08/2023  Name: Ruth Snyder MRN: [id number] DOB: July 07, 2001  Today's TOC FU Call Status: Today's TOC FU Call Status:: Unsuccessful Call (2nd Attempt) Unsuccessful Call (1st Attempt) Date: 04/07/23 Unsuccessful Call (2nd Attempt) Date: 04/08/23  Attempted to reach the patient regarding the most recent Inpatient/ED visit.  Follow Up Plan: Additional outreach attempts will be made to reach the patient to complete the Transitions of Care (Post Inpatient/ED visit) call.   Signature  Vicente Males, LPN [facility] AWV Team Direct Dial:[phone]

## 2023-04-09 ENCOUNTER — Ambulatory Visit: Payer: Medicaid Other

## 2023-04-09 ENCOUNTER — Other Ambulatory Visit: Payer: Medicaid Other

## 2023-04-13 ENCOUNTER — Other Ambulatory Visit: Payer: Medicaid Other

## 2023-04-13 DIAGNOSIS — B349 Viral infection, unspecified: Secondary | ICD-10-CM

## 2023-04-13 DIAGNOSIS — B2790 Infectious mononucleosis, unspecified without complication: Secondary | ICD-10-CM

## 2023-04-27 ENCOUNTER — Ambulatory Visit
Admission: EM | Admit: 2023-04-27 | Discharge: 2023-04-27 | Disposition: A | Discharge status: Home / Self Care | Payer: Medicaid Other

## 2023-04-27 DIAGNOSIS — B359 Dermatophytosis, unspecified: Secondary | ICD-10-CM

## 2023-04-27 MED ORDER — KETOCONAZOLE 2 % EX CREA: 1.0000 | TOPICAL_CREAM | Freq: Two times a day (BID) | CUTANEOUS | 0 refills | Status: DC

## 2023-04-27 NOTE — ED Triage Notes (Signed)
Pt presents with rash/ quarter size red circle on left calf that started 4 days ago. Now having white bumps and oozing clear yellow liquid.

## 2023-04-27 NOTE — Discharge Instructions (Signed)
Take medication as prescribed. May also take over-the-counter Zyrtec to help with itching. Avoid hot baths or showers while symptoms persist.  Recommend taking lukewarm baths. May apply cool cloths to the area to help with itching or discomfort. Avoid scratching, rubbing, or manipulating the areas while symptoms persist. Strict hand hygiene while symptoms persist. If you are out in public, keep the area covered with a bandage, may leave it open to air at home. Please be advised that it may take several weeks for the area on your leg to completely heal. If the symptoms do not improve with this treatment, please follow-up with your primary care physician for further evaluation. Follow-up as needed.Marland Kitchen

## 2023-04-27 NOTE — ED Provider Notes (Signed)
[facility]    CSN: [id number] Arrival date & time: 04/27/23  1718      History   Chief Complaint Chief Complaint  Patient presents with   Rash    HPI Ruth Snyder is a 22 y.o. female.   The history is provided by the patient.   Patient presents for complaints of rash to the left lower leg.  Patient states symptoms started approximately 4 days ago.  She denies exposure to new soaps, medications, lotions, foods, or detergents.  She states that the area on the left lower leg is about the size of a quarter, she states that the area has been itching, has white bumps within the area, and is now oozing yellowish drainage.  Past Medical History:  Diagnosis Date   GERD (gastroesophageal reflux disease)    Migraine    Speech delay     Patient Active Problem List   Diagnosis Date Noted   Seasonal allergies 04/03/2023   Childhood obesity, BMI 95-100 percentile 10/02/2020   History of headache 04/27/2020   Wears glasses 04/27/2020   Migraine    Viral syndrome 02/23/2013   Dermatitis 02/23/2013   Speech delay     History reviewed. No pertinent surgical history.  OB History   No obstetric history on file.      Home Medications    Prior to Admission medications   Medication Sig Start Date End Date Taking? Authorizing Provider  ketoconazole (NIZORAL) 2 % cream Apply 1 Application topically 2 (two) times daily. 04/27/23  Yes Gregroy Dombkowski-Warren, Sadie Haber, NP  promethazine (PHENERGAN) 12.5 MG tablet Take 1 tablet (12.5 mg total) by mouth every 8 (eight) hours as needed for nausea or vomiting. 10/13/22  Yes Carmita Boom-Warren, Sadie Haber, NP  rizatriptan (MAXALT-MLT) 10 MG disintegrating tablet Take by mouth. 04/25/23  Yes [provider]  acetaminophen (TYLENOL) 500 MG tablet Take 2 tablets (1,000 mg total) by mouth every 6 (six) hours as needed. 03/21/22   Carlisle Beers, FNP  meloxicam (MOBIC) 15 MG tablet Take 1 tablet (15 mg total) by mouth daily. 12/26/21    Donita Brooks, MD  naproxen (NAPROSYN) 500 MG tablet Take 1 tablet (500 mg total) by mouth 2 (two) times daily with a meal. 07/25/22   Eber Hong, MD  ondansetron (ZOFRAN-ODT) 4 MG disintegrating tablet Take 1 tablet (4 mg total) by mouth every 8 (eight) hours as needed for nausea. 07/25/22   Eber Hong, MD  predniSONE (DELTASONE) 20 MG tablet 3 tabs poqday 1-2, 2 tabs poqday 3-4, 1 tab poqday 5-6 04/07/23   Donita Brooks, MD    Family History History reviewed. No pertinent family history.  Social History Social History   Tobacco Use   Smoking status: Never    Passive exposure: Yes   Smokeless tobacco: Never  Vaping Use   Vaping status: Never Used  Substance Use Topics   Alcohol use: Never   Drug use: Never     Allergies   Patient has no known allergies.   Review of Systems Review of Systems Per HPI  Physical Exam Triage Vital Signs ED Triage Vitals  Encounter Vitals Group     BP 04/27/23 1818 138/89     Systolic BP Percentile --      Diastolic BP Percentile --      Pulse Rate 04/27/23 1818 (!) 108     Resp 04/27/23 1818 16     Temp 04/27/23 1818 98.3 F (36.8 C)  Temp Source 04/27/23 1818 Oral     SpO2 04/27/23 1818 98 %     Weight --      Height --      Head Circumference --      Peak Flow --      Pain Score 04/27/23 1822 6     Pain Loc --      Pain Education --      Exclude from Growth Chart --    No data found.  Updated Vital Signs BP 138/89 (BP Location: Right Arm)   Pulse (!) 108   Temp 98.3 F (36.8 C) (Oral)   Resp 16   LMP 04/10/2023 (Exact Date)   SpO2 98%   Visual Acuity Right Eye Distance:   Left Eye Distance:   Bilateral Distance:    Right Eye Near:   Left Eye Near:    Bilateral Near:     Physical Exam Vitals and nursing note reviewed.  Constitutional:      General: She is not in acute distress.    Appearance: Normal appearance.  HENT:     Head: Normocephalic.  Eyes:     Extraocular Movements: Extraocular  movements intact.     Pupils: Pupils are equal, round, and reactive to light.  Pulmonary:     Effort: Pulmonary effort is normal.  Musculoskeletal:     Cervical back: Normal range of motion.  Skin:    General: Skin is warm and dry.     Findings: Rash present.       Neurological:     General: No focal deficit present.     Mental Status: She is alert and oriented to person, place, and time.  Psychiatric:        Mood and Affect: Mood normal.        Behavior: Behavior normal.      UC Treatments / Results  Labs (all labs ordered are listed, but only abnormal results are displayed) Labs Reviewed - No data to display  EKG   Radiology No results found.  Procedures Procedures (including critical care time)  Medications Ordered in UC Medications - No data to display  Initial Impression / Assessment and Plan / UC Course  I have reviewed the triage vital signs and the nursing notes.  Pertinent labs & imaging results that were available during my care of the patient were reviewed by me and considered in my medical decision making (see chart for details).  The patient is well-appearing, she is in no acute distress, vital signs are stable.  Symptoms are consistent with tinea.  Will treat with ketoconazole cream.  Supportive care recommendations were provided and discussed with the patient to include strict hand hygiene, avoiding rubbing or manipulating the areas, and keeping the area clean and dry.  Patient was advised that symptoms can take several weeks for complete resolution, advised if symptoms are not improving with this treatment, it is recommended that she follow-up with her primary care physician for further evaluation.  Patient is in agreement with this plan of care and verbalizes understanding.  All questions were answered.  Patient stable for discharge.   Final Clinical Impressions(s) / UC Diagnoses   Final diagnoses:  Tinea     Discharge Instructions      Take  medication as prescribed. May also take over-the-counter Zyrtec to help with itching. Avoid hot baths or showers while symptoms persist.  Recommend taking lukewarm baths. May apply cool cloths to the area to help with itching or  discomfort. Avoid scratching, rubbing, or manipulating the areas while symptoms persist. Strict hand hygiene while symptoms persist. If you are out in public, keep the area covered with a bandage, may leave it open to air at home. Please be advised that it may take several weeks for the area on your leg to completely heal. If the symptoms do not improve with this treatment, please follow-up with your primary care physician for further evaluation. Follow-up as needed..      ED Prescriptions     Medication Sig Dispense Auth. Provider   ketoconazole (NIZORAL) 2 % cream Apply 1 Application topically 2 (two) times daily. 60 g Sonna Lipsky-Warren, Sadie Haber, NP      PDMP not reviewed this encounter.   Abran Cantor, NP 04/27/23 1839

## 2023-05-06 ENCOUNTER — Ambulatory Visit
Admission: EM | Admit: 2023-05-06 | Discharge: 2023-05-06 | Disposition: A | Discharge status: Home / Self Care | Payer: Medicaid Other | Attending: Nurse Practitioner | Admitting: Nurse Practitioner

## 2023-05-06 DIAGNOSIS — L03116 Cellulitis of left lower limb: Secondary | ICD-10-CM

## 2023-05-06 MED ORDER — CEFTRIAXONE SODIUM 1 G IJ SOLR
1.0000 g | Freq: Once | INTRAMUSCULAR | Status: AC
  Administered 2023-05-06: 1 g via INTRAMUSCULAR

## 2023-05-06 MED ORDER — MUPIROCIN 2 % EX OINT: 1.0000 | TOPICAL_OINTMENT | Freq: Two times a day (BID) | CUTANEOUS | 0 refills | Status: DC

## 2023-05-06 MED ORDER — DOXYCYCLINE HYCLATE 100 MG PO TABS: 100.0000 mg | ORAL_TABLET | Freq: Two times a day (BID) | ORAL | 0 refills | Status: AC

## 2023-05-06 MED ORDER — CHLORHEXIDINE GLUCONATE 4 % EX SOLN: CUTANEOUS | 0 refills | Status: AC

## 2023-05-06 NOTE — ED Notes (Addendum)
Large area of redness, discoloration, and small blisters noted to left lateral calf. +3 pitting edema noted in left foot and ankle. Cap refill >3 secs. DP pulse weak. Pt reports increase in pain and intermittent numbness in LLE. NP aware and at bedside.

## 2023-05-06 NOTE — ED Triage Notes (Addendum)
Pt states that x 2 weeks ago she was here diagnosed with ring worm given a cream was told by her co-worker that her, she could put bleach on it and it would kill the ring worm so she did that and it started to spread and appears to be infected the spot is now swollen down into the ankle and gives sharp pain through the leg when walking.  Numbness in her foot

## 2023-05-06 NOTE — ED Notes (Signed)
Site cleaned with Hibiclens and sterile water. Pt tolerated well.   Applied bacitracin, nonadherent pad, secured with gauze wrap. Pt tolerated well.   Site management and infection prevention education reviewed. Pt verbalized understanding.

## 2023-05-06 NOTE — ED Provider Notes (Signed)
[facility]    CSN: [id number] Arrival date & time: 05/06/23  1046      History   Chief Complaint No chief complaint on file.   HPI Ruth Snyder is a 22 y.o. female.   The history is provided by the patient.   Patient presents for wound to left lower extremity that has been present for the past several days.  Patient was seen in this clinic on 04/27/2023 and diagnosed with tinea.  She was given ketoconazole cream to apply to the affected area.  Patient states when she was at work, a friend told her to use Clorox.  She states she used a Clorox to the site on 1 occasion.  She states that her left lower leg has been swollen, is weeping, and her left foot feels numb.  She denies fever, chills, chest pain, abdominal pain, nausea, vomiting, or diarrhea.  She reports she has continued using the ketoconazole cream, last used 1 day ago.  Past Medical History:  Diagnosis Date   GERD (gastroesophageal reflux disease)    Migraine    Speech delay     Patient Active Problem List   Diagnosis Date Noted   Seasonal allergies 04/03/2023   Childhood obesity, BMI 95-100 percentile 10/02/2020   History of headache 04/27/2020   Wears glasses 04/27/2020   Migraine    Viral syndrome 02/23/2013   Dermatitis 02/23/2013   Speech delay     History reviewed. No pertinent surgical history.  OB History   No obstetric history on file.      Home Medications    Prior to Admission medications   Medication Sig Start Date End Date Taking? Authorizing Provider  chlorhexidine (HIBICLENS) 4 % external liquid Cleanse the affected area twice daily while symptoms persist. 05/06/23  Yes Muhammed Teutsch-Warren, Sadie Haber, NP  doxycycline (VIBRA-TABS) 100 MG tablet Take 1 tablet (100 mg total) by mouth 2 (two) times daily for 5 days. 05/06/23 05/11/23 Yes Emmalea Treanor-Warren, Sadie Haber, NP  mupirocin ointment (BACTROBAN) 2 % Apply 1 Application topically 2 (two) times daily. 05/06/23  Yes Rylah Fukuda-Warren, Sadie Haber,  NP  acetaminophen (TYLENOL) 500 MG tablet Take 2 tablets (1,000 mg total) by mouth every 6 (six) hours as needed. 03/21/22   Carlisle Beers, FNP  ketoconazole (NIZORAL) 2 % cream Apply 1 Application topically 2 (two) times daily. 04/27/23   Dallana Mavity-Warren, Sadie Haber, NP  meloxicam (MOBIC) 15 MG tablet Take 1 tablet (15 mg total) by mouth daily. 12/26/21   Donita Brooks, MD  naproxen (NAPROSYN) 500 MG tablet Take 1 tablet (500 mg total) by mouth 2 (two) times daily with a meal. 07/25/22   Eber Hong, MD  ondansetron (ZOFRAN-ODT) 4 MG disintegrating tablet Take 1 tablet (4 mg total) by mouth every 8 (eight) hours as needed for nausea. 07/25/22   Eber Hong, MD  predniSONE (DELTASONE) 20 MG tablet 3 tabs poqday 1-2, 2 tabs poqday 3-4, 1 tab poqday 5-6 04/07/23   Donita Brooks, MD  promethazine (PHENERGAN) 12.5 MG tablet Take 1 tablet (12.5 mg total) by mouth every 8 (eight) hours as needed for nausea or vomiting. 10/13/22   Taraann Olthoff-Warren, Sadie Haber, NP  rizatriptan (MAXALT-MLT) 10 MG disintegrating tablet Take by mouth. 04/25/23   [provider]    Family History History reviewed. No pertinent family history.  Social History Social History   Tobacco Use   Smoking status: Never    Passive exposure: Yes   Smokeless tobacco: Never  Vaping Use  Vaping status: Never Used  Substance Use Topics   Alcohol use: Never   Drug use: Never     Allergies   Patient has no known allergies.   Review of Systems Review of Systems Per HPI  Physical Exam Triage Vital Signs ED Triage Vitals  Encounter Vitals Group     BP 05/06/23 1148 (!) 144/87     Systolic BP Percentile --      Diastolic BP Percentile --      Pulse Rate 05/06/23 1148 92     Resp 05/06/23 1148 13     Temp 05/06/23 1148 98.6 F (37 C)     Temp Source 05/06/23 1148 Oral     SpO2 05/06/23 1148 98 %     Weight --      Height --      Head Circumference --      Peak Flow --      Pain Score 05/06/23 1149  10     Pain Loc --      Pain Education --      Exclude from Growth Chart --    No data found.  Updated Vital Signs BP (!) 144/87 (BP Location: Right Arm)   Pulse 92   Temp 98.6 F (37 C) (Oral)   Resp 13   LMP 04/10/2023 (Exact Date)   SpO2 98%   Visual Acuity Right Eye Distance:   Left Eye Distance:   Bilateral Distance:    Right Eye Near:   Left Eye Near:    Bilateral Near:     Physical Exam Vitals and nursing note reviewed.  Constitutional:      General: She is not in acute distress.    Appearance: Normal appearance.  HENT:     Head: Normocephalic.  Eyes:     Extraocular Movements: Extraocular movements intact.     Pupils: Pupils are equal, round, and reactive to light.  Skin:    General: Skin is warm and dry.     Findings: Wound present.     Comments: See attached images. Wound to lateral posterior left lower leg. Oozing and drainage are present. Cap refill > 2 sec, neurovascular status of left foot is intact.  Neurological:     General: No focal deficit present.     Mental Status: She is alert and oriented to person, place, and time.  Psychiatric:        Mood and Affect: Mood normal.        Behavior: Behavior normal.           UC Treatments / Results  Labs (all labs ordered are listed, but only abnormal results are displayed) Labs Reviewed - No data to display  EKG   Radiology No results found.  Procedures Procedures (including critical care time)  Medications Ordered in UC Medications  cefTRIAXone (ROCEPHIN) injection 1 g (1 g Intramuscular Given 05/06/23 1238)    Initial Impression / Assessment and Plan / UC Course  I have reviewed the triage vital signs and the nursing notes.  Pertinent labs & imaging results that were available during my care of the patient were reviewed by me and considered in my medical decision making (see chart for details).  The patient is well-appearing, she is in no acute distress, vital signs are  stable.  Symptoms consistent with left lower extremity cellulitis.  Rocephin 1 g IM administered.  Will start patient on doxycycline 100 mg tablets, and mupirocin ointment 2%, and Hibiclens 4% external liquid to  cleanse the area.  Supportive care recommendations were provided and discussed with the patient to include over-the-counter analgesics for pain or discomfort, weightbearing only as tolerated, and elevating the lower extremity to help with pain or swelling.  Patient advised to follow-up in this clinic on 05/07/2023 for reevaluation.  Patient was given strict ER follow-up precautions.  Patient is in agreement with this plan of care and verbalizes understanding.  All questions were answered.  Patient stable for discharge.  Work note was provided.  Final Clinical Impressions(s) / UC Diagnoses   Final diagnoses:  Cellulitis of left lower leg     Discharge Instructions      You have been given an Rocephin 1 g today.  Do not start your antibiotics until tomorrow. May take over-the-counter Tylenol or ibuprofen as needed for pain, fever, general discomfort. Leave the dressing in place until you have been seen on 05/07/2023. Keep the left lower leg elevated to help decrease swelling. Weight bearing as tolerated, use the crutches provided with walking. Apply ice to the affected area as needed for swelling or pain.  Place a cloth between the ice pack and your skin to prevent further irritation. Monitor symptoms for worsening to include fever, chills, foul-smelling drainage or other concerns, if so, go to the emergency department immediately. Follow-up on 05/07/23 for wound check.       ED Prescriptions     Medication Sig Dispense Auth. Provider   doxycycline (VIBRA-TABS) 100 MG tablet Take 1 tablet (100 mg total) by mouth 2 (two) times daily for 5 days. 10 tablet Soma Bachand-Warren, Sadie Haber, NP   mupirocin ointment (BACTROBAN) 2 % Apply 1 Application topically 2 (two) times daily. 30 g Nanami Whitelaw-Warren,  Sadie Haber, NP   chlorhexidine (HIBICLENS) 4 % external liquid Cleanse the affected area twice daily while symptoms persist. 118 mL Adi Seales-Warren, Sadie Haber, NP      PDMP not reviewed this encounter.   Abran Cantor, NP 05/06/23 1312

## 2023-05-06 NOTE — Discharge Instructions (Addendum)
You have been given an Rocephin 1 g today.  Do not start your antibiotics until tomorrow. May take over-the-counter Tylenol or ibuprofen as needed for pain, fever, general discomfort. Leave the dressing in place until you have been seen on 05/07/2023. Keep the left lower leg elevated to help decrease swelling. Weight bearing as tolerated, use the crutches provided with walking. Apply ice to the affected area as needed for swelling or pain.  Place a cloth between the ice pack and your skin to prevent further irritation. Monitor symptoms for worsening to include fever, chills, foul-smelling drainage or other concerns, if so, go to the emergency department immediately. Follow-up on 05/07/23 for wound check.

## 2023-05-07 ENCOUNTER — Telehealth: Payer: Self-pay | Admitting: Family Medicine

## 2023-05-07 ENCOUNTER — Ambulatory Visit
Admission: EM | Admit: 2023-05-07 | Discharge: 2023-05-07 | Disposition: A | Discharge status: Home / Self Care | Payer: Medicaid Other | Attending: Nurse Practitioner | Admitting: Nurse Practitioner

## 2023-05-07 VITALS — BP 121/67 | HR 113 | Temp 99.5°F | Resp 18

## 2023-05-07 DIAGNOSIS — Z5189 Encounter for other specified aftercare: Secondary | ICD-10-CM

## 2023-05-07 DIAGNOSIS — L03116 Cellulitis of left lower limb: Secondary | ICD-10-CM

## 2023-05-07 NOTE — Telephone Encounter (Signed)
Patient ok with not being sent via MyChart.

## 2023-05-07 NOTE — ED Provider Notes (Signed)
[facility]    CSN: [id number] Arrival date & time: 05/07/23  1155      History   Chief Complaint No chief complaint on file.   HPI Ruth Snyder is a 22 y.o. female.   The history is provided by the patient.   Patient presents for follow-up for recheck of the wound to the left lower leg.  Patient reports that she has started the doxycycline.  She also reports that she has cleansed the area with the Hibiclens soap and apply mupirocin as prescribed.  She denies fever, chills, chest pain, abdominal pain, nausea, vomiting, diarrhea, increased redness of the lower leg, increased swelling, or foul-smelling drainage from the site.  Patient reports that the left foot feels much better today.  She states "it does not feel numb".  Past Medical History:  Diagnosis Date   GERD (gastroesophageal reflux disease)    Migraine    Speech delay     Patient Active Problem List   Diagnosis Date Noted   Seasonal allergies 04/03/2023   Childhood obesity, BMI 95-100 percentile 10/02/2020   History of headache 04/27/2020   Wears glasses 04/27/2020   Migraine    Viral syndrome 02/23/2013   Dermatitis 02/23/2013   Speech delay     History reviewed. No pertinent surgical history.  OB History   No obstetric history on file.      Home Medications    Prior to Admission medications   Medication Sig Start Date End Date Taking? Authorizing Provider  acetaminophen (TYLENOL) 500 MG tablet Take 2 tablets (1,000 mg total) by mouth every 6 (six) hours as needed. 03/21/22   Carlisle Beers, FNP  chlorhexidine (HIBICLENS) 4 % external liquid Cleanse the affected area twice daily while symptoms persist. 05/06/23   Deagen Krass-Warren, Sadie Haber, NP  doxycycline (VIBRA-TABS) 100 MG tablet Take 1 tablet (100 mg total) by mouth 2 (two) times daily for 5 days. 05/06/23 05/11/23  Erin Uecker-Warren, Sadie Haber, NP  ketoconazole (NIZORAL) 2 % cream Apply 1 Application topically 2 (two) times daily. 04/27/23    Mercy Malena-Warren, Sadie Haber, NP  meloxicam (MOBIC) 15 MG tablet Take 1 tablet (15 mg total) by mouth daily. 12/26/21   Donita Brooks, MD  mupirocin ointment (BACTROBAN) 2 % Apply 1 Application topically 2 (two) times daily. 05/06/23   Daleiza Bacchi-Warren, Sadie Haber, NP  naproxen (NAPROSYN) 500 MG tablet Take 1 tablet (500 mg total) by mouth 2 (two) times daily with a meal. 07/25/22   Eber Hong, MD  ondansetron (ZOFRAN-ODT) 4 MG disintegrating tablet Take 1 tablet (4 mg total) by mouth every 8 (eight) hours as needed for nausea. 07/25/22   Eber Hong, MD  predniSONE (DELTASONE) 20 MG tablet 3 tabs poqday 1-2, 2 tabs poqday 3-4, 1 tab poqday 5-6 04/07/23   Donita Brooks, MD  promethazine (PHENERGAN) 12.5 MG tablet Take 1 tablet (12.5 mg total) by mouth every 8 (eight) hours as needed for nausea or vomiting. 10/13/22   Blessin Kanno-Warren, Sadie Haber, NP  rizatriptan (MAXALT-MLT) 10 MG disintegrating tablet Take by mouth. 04/25/23   [provider]    Family History History reviewed. No pertinent family history.  Social History Social History   Tobacco Use   Smoking status: Never    Passive exposure: Yes   Smokeless tobacco: Never  Vaping Use   Vaping status: Never Used  Substance Use Topics   Alcohol use: Never   Drug use: Never     Allergies   Patient has no  known allergies.   Review of Systems Review of Systems Per HPI  Physical Exam Triage Vital Signs ED Triage Vitals  Encounter Vitals Group     BP 05/07/23 1200 121/67     Systolic BP Percentile --      Diastolic BP Percentile --      Pulse Rate 05/07/23 1159 (!) 113     Resp 05/07/23 1159 18     Temp 05/07/23 1159 99.5 F (37.5 C)     Temp Source 05/07/23 1159 Oral     SpO2 05/07/23 1159 97 %     Weight --      Height --      Head Circumference --      Peak Flow --      Pain Score 05/07/23 1159 8     Pain Loc --      Pain Education --      Exclude from Growth Chart --    No data found.  Updated Vital  Signs BP 121/67   Pulse (!) 113   Temp 99.5 F (37.5 C) (Oral)   Resp 18   LMP 04/10/2023 (Exact Date)   SpO2 97%   Visual Acuity Right Eye Distance:   Left Eye Distance:   Bilateral Distance:    Right Eye Near:   Left Eye Near:    Bilateral Near:     Physical Exam Vitals and nursing note reviewed.  Constitutional:      General: She is not in acute distress.    Appearance: Normal appearance.  HENT:     Head: Normocephalic.  Eyes:     Extraocular Movements: Extraocular movements intact.     Pupils: Pupils are equal, round, and reactive to light.  Pulmonary:     Effort: Pulmonary effort is normal.  Musculoskeletal:     Cervical back: Normal range of motion.  Skin:    General: Skin is warm and dry.     Findings: Wound present.     Comments: Wound noted to the left lower leg.  No increased redness or drainage present.  See attached images.  Neurological:     General: No focal deficit present.     Mental Status: She is alert and oriented to person, place, and time.  Psychiatric:        Mood and Affect: Mood normal.        Behavior: Behavior normal.           UC Treatments / Results  Labs (all labs ordered are listed, but only abnormal results are displayed) Labs Reviewed - No data to display  EKG   Radiology No results found.  Procedures Procedures (including critical care time)  Medications Ordered in UC Medications - No data to display  Initial Impression / Assessment and Plan / UC Course  I have reviewed the triage vital signs and the nursing notes.  Pertinent labs & imaging results that were available during my care of the patient were reviewed by me and considered in my medical decision making (see chart for details).  The patient is well-appearing, she is in no acute distress, vital signs are stable.  Continue current medications to include doxycycline, mupirocin ointment, and Hibiclens soap.  Supportive care recommendations provided to the  patient to include continuing over-the-counter analgesics for pain or discomfort, elevating the left lower extremity, and to continue to monitor the area for worsening symptoms.  Patient was advised if symptoms worsen or other concerns, recommend following up in this clinic  or in the emergency department for further evaluation.  Patient is in agreement with this plan of care and verbalizes understanding.  All questions were answered.  Patient stable for discharge.  Final Clinical Impressions(s) / UC Diagnoses   Final diagnoses:  Cellulitis of left lower leg  Encounter for wound re-check     Discharge Instructions      Continue your current antibiotics. May take over-the-counter Tylenol or ibuprofen as needed for pain, fever, general discomfort. Change dressing daily while symptoms persist. Keep the left lower leg elevated to help decrease swelling. Weight bearing as tolerated, use the crutches provided with walking. Apply ice to the affected area as needed for swelling or pain.  Place a cloth between the ice pack and your skin to prevent further irritation. Monitor symptoms for worsening to include fever, chills, foul-smelling drainage or other concerns, if so, go to the emergency department immediately. If symptoms do not improve over the next 7 to 10 days, please follow-up with your primary care physician for further evaluation. Follow-up as needed.     ED Prescriptions   None    PDMP not reviewed this encounter.   Abran Cantor, NP 05/07/23 1221

## 2023-05-07 NOTE — Telephone Encounter (Signed)
Patient called to follow up on recent visit to ED; stated she had ringworm which advanced into cellulitis and was put on crutches.   Patient stated ED will only write her a note excusing her from work for 3 days; patient requesting a note excusing her from work for four (4) days which would include this Saturday, 05/09/23 so she doesn't lose her job.   Please advise at [phone].

## 2023-05-07 NOTE — Discharge Instructions (Addendum)
Continue your current antibiotics. May take over-the-counter Tylenol or ibuprofen as needed for pain, fever, general discomfort. Change dressing daily while symptoms persist. Keep the left lower leg elevated to help decrease swelling. Weight bearing as tolerated, use the crutches provided with walking. Apply ice to the affected area as needed for swelling or pain.  Place a cloth between the ice pack and your skin to prevent further irritation. Monitor symptoms for worsening to include fever, chills, foul-smelling drainage or other concerns, if so, go to the emergency department immediately. If symptoms do not improve over the next 7 to 10 days, please follow-up with your primary care physician for further evaluation. Follow-up as needed.

## 2023-05-07 NOTE — ED Triage Notes (Signed)
Pt is here to get her wounds on her legs rechecked.

## 2023-05-12 ENCOUNTER — Telehealth: Payer: Self-pay

## 2023-05-12 NOTE — Telephone Encounter (Signed)
Pt called in to request a refill of this med mupirocin ointment (BACTROBAN) 2 % [[id number]] to be sent to [facility].   Cb#: [phone]

## 2023-05-13 ENCOUNTER — Ambulatory Visit
Admission: RE | Admit: 2023-05-13 | Discharge: 2023-05-13 | Disposition: A | Discharge status: Home / Self Care | Payer: Medicaid Other | Source: Ambulatory Visit | Attending: Family Medicine | Admitting: Family Medicine

## 2023-05-13 VITALS — BP 116/79 | HR 105 | Temp 98.4°F | Resp 18

## 2023-05-13 DIAGNOSIS — L03116 Cellulitis of left lower limb: Secondary | ICD-10-CM

## 2023-05-13 MED ORDER — MUPIROCIN 2 % EX OINT: 1.0000 | TOPICAL_OINTMENT | Freq: Two times a day (BID) | CUTANEOUS | 1 refills | Status: AC

## 2023-05-13 NOTE — ED Provider Notes (Signed)
[facility]    CSN: [id number] Arrival date & time: 05/13/23  1742      History   Chief Complaint No chief complaint on file.   HPI Ruth Snyder is a 22 y.o. female.   Presenting today following up on cellulitis of the left lower leg.  Completed a course of doxycycline, has been using Hibiclens, mupirocin and keeping the area covered.  States it is healing well and denies drainage, fevers, chills, body aches.  Requesting a refill on the mupirocin ointment.    Past Medical History:  Diagnosis Date   GERD (gastroesophageal reflux disease)    Migraine    Speech delay     Patient Active Problem List   Diagnosis Date Noted   Seasonal allergies 04/03/2023   Childhood obesity, BMI 95-100 percentile 10/02/2020   History of headache 04/27/2020   Wears glasses 04/27/2020   Migraine    Viral syndrome 02/23/2013   Dermatitis 02/23/2013   Speech delay     History reviewed. No pertinent surgical history.  OB History   No obstetric history on file.      Home Medications    Prior to Admission medications   Medication Sig Start Date End Date Taking? Authorizing Provider  acetaminophen (TYLENOL) 500 MG tablet Take 2 tablets (1,000 mg total) by mouth every 6 (six) hours as needed. 03/21/22   Carlisle Beers, FNP  chlorhexidine (HIBICLENS) 4 % external liquid Cleanse the affected area twice daily while symptoms persist. 05/06/23   Leath-Warren, Sadie Haber, NP  ketoconazole (NIZORAL) 2 % cream Apply 1 Application topically 2 (two) times daily. 04/27/23   Leath-Warren, Sadie Haber, NP  meloxicam (MOBIC) 15 MG tablet Take 1 tablet (15 mg total) by mouth daily. 12/26/21   Donita Brooks, MD  mupirocin ointment (BACTROBAN) 2 % Apply 1 Application topically 2 (two) times daily. 05/13/23   Particia Nearing, PA-C  naproxen (NAPROSYN) 500 MG tablet Take 1 tablet (500 mg total) by mouth 2 (two) times daily with a meal. 07/25/22   Eber Hong, MD  ondansetron  (ZOFRAN-ODT) 4 MG disintegrating tablet Take 1 tablet (4 mg total) by mouth every 8 (eight) hours as needed for nausea. 07/25/22   Eber Hong, MD  promethazine (PHENERGAN) 12.5 MG tablet Take 1 tablet (12.5 mg total) by mouth every 8 (eight) hours as needed for nausea or vomiting. 10/13/22   Leath-Warren, Sadie Haber, NP  rizatriptan (MAXALT-MLT) 10 MG disintegrating tablet Take by mouth. 04/25/23   [provider]    Family History History reviewed. No pertinent family history.  Social History Social History   Tobacco Use   Smoking status: Never    Passive exposure: Yes   Smokeless tobacco: Never  Vaping Use   Vaping status: Never Used  Substance Use Topics   Alcohol use: Never   Drug use: Never     Allergies   Patient has no known allergies.   Review of Systems Review of Systems PER HPI  Physical Exam Triage Vital Signs ED Triage Vitals  Encounter Vitals Group     BP 05/13/23 1745 116/79     Systolic BP Percentile --      Diastolic BP Percentile --      Pulse Rate 05/13/23 1745 (!) 105     Resp 05/13/23 1745 18     Temp 05/13/23 1745 98.4 F (36.9 C)     Temp Source 05/13/23 1745 Oral     SpO2 05/13/23 1745 95 %  Weight --      Height --      Head Circumference --      Peak Flow --      Pain Score 05/13/23 1746 0     Pain Loc --      Pain Education --      Exclude from Growth Chart --    No data found.  Updated Vital Signs BP 116/79 (BP Location: Right Arm)   Pulse (!) 105   Temp 98.4 F (36.9 C) (Oral)   Resp 18   LMP 05/11/2023 (Exact Date)   SpO2 95%   Visual Acuity Right Eye Distance:   Left Eye Distance:   Bilateral Distance:    Right Eye Near:   Left Eye Near:    Bilateral Near:     Physical Exam Vitals and nursing note reviewed.  Constitutional:      Appearance: Normal appearance. She is not ill-appearing.  HENT:     Head: Atraumatic.  Eyes:     Extraocular Movements: Extraocular movements intact.      Conjunctiva/sclera: Conjunctivae normal.  Cardiovascular:     Rate and Rhythm: Normal rate and regular rhythm.     Heart sounds: Normal heart sounds.  Pulmonary:     Effort: Pulmonary effort is normal.     Breath sounds: Normal breath sounds.  Musculoskeletal:        General: Normal range of motion.     Cervical back: Normal range of motion and neck supple.  Skin:    General: Skin is warm.     Comments: Scabbing, hyperpigmentation to the left lower leg, improved from previous picture.  Neurological:     Mental Status: She is alert and oriented to person, place, and time.     Comments: Left lower extremity neurovascularly intact  Psychiatric:        Mood and Affect: Mood normal.        Thought Content: Thought content normal.        Judgment: Judgment normal.      UC Treatments / Results  Labs (all labs ordered are listed, but only abnormal results are displayed) Labs Reviewed - No data to display  EKG   Radiology No results found.  Procedures Procedures (including critical care time)  Medications Ordered in UC Medications - No data to display  Initial Impression / Assessment and Plan / UC Course  I have reviewed the triage vital signs and the nursing notes.  Pertinent labs & imaging results that were available during my care of the patient were reviewed by me and considered in my medical decision making (see chart for details).     Refilled mupirocin ointment, has completed doxycycline.  Appears to be healing well without complication.  Dressing applied, and discussed home wound care and return precautions.  Final Clinical Impressions(s) / UC Diagnoses   Final diagnoses:  Cellulitis of leg, left   Discharge Instructions   None    ED Prescriptions     Medication Sig Dispense Auth. Provider   mupirocin ointment (BACTROBAN) 2 % Apply 1 Application topically 2 (two) times daily. 80 g Particia Nearing, [place]      PDMP not reviewed this encounter.    Particia Nearing, [place] 05/13/23 1809

## 2023-05-13 NOTE — ED Triage Notes (Signed)
States she needs a refill on mupirocin ointment for the cellulitis on her left lower leg.

## 2023-06-01 ENCOUNTER — Ambulatory Visit
Admission: EM | Admit: 2023-06-01 | Discharge: 2023-06-01 | Disposition: A | Discharge status: Home / Self Care | Payer: Medicaid Other | Attending: Nurse Practitioner | Admitting: Nurse Practitioner

## 2023-06-01 DIAGNOSIS — L03032 Cellulitis of left toe: Secondary | ICD-10-CM

## 2023-06-01 DIAGNOSIS — B354 Tinea corporis: Secondary | ICD-10-CM

## 2023-06-01 MED ORDER — DOXYCYCLINE HYCLATE 100 MG PO CAPS
100.0000 mg | ORAL_CAPSULE | Freq: Two times a day (BID) | ORAL | 0 refills | Status: AC
Start: 2023-06-01 — End: 2023-06-08

## 2023-06-01 MED ORDER — MICONAZOLE NITRATE 2 % EX CREA: 1.0000 | TOPICAL_CREAM | Freq: Two times a day (BID) | CUTANEOUS | 0 refills | Status: AC

## 2023-06-01 NOTE — Discharge Instructions (Addendum)
I believe the skin under your left great toe is infected.  I drained a little bit of fluid from the toe today.  Please take the antibiotic as prescribed and start warm salt soaks at home.    I am also concerned that the toe could be broken, please go to [facility] to get the x-ray done. We will contact you tomorrow if it is abnormal.   Regarding the ring worm, start using the miconazole cream I have sent to the pharmacy.  Use this twice daily until full resolution of the rash, then for 1 more week.  Keep the rash covered.  Seek care if the rash does not improve with this treatment.

## 2023-06-01 NOTE — ED Provider Notes (Signed)
[facility]    CSN: [id number] Arrival date & time: 06/01/23  1458      History   Chief Complaint Chief Complaint  Patient presents with   Toe Pain    HPI Ruth Snyder is a 22 y.o. female.   Patient presents today with 2 concerns.  First, she is concerned about left great toe that she stubbed 1 week ago.  Reports the toe is very painful, and is hard to walk because of the pain, and is very swollen and red.  She reports there is a little bit of clear drainage coming from around the toenail.  No fevers or nausea/vomiting.  Has not applied anything to the area so far.  Patient also concerned about ringworm to the left calf, wants to make sure it is improving and she can leave it open to air.  Reports she has treated the area with cream for the past couple weeks without improvement.  Reports she shortly thereafter got cellulitis of the leg which did improve.  Reports area is not itchy.    Past Medical History:  Diagnosis Date   GERD (gastroesophageal reflux disease)    Migraine    Speech delay     Patient Active Problem List   Diagnosis Date Noted   Seasonal allergies 04/03/2023   Childhood obesity, BMI 95-100 percentile 10/02/2020   History of headache 04/27/2020   Wears glasses 04/27/2020   Migraine    Viral syndrome 02/23/2013   Dermatitis 02/23/2013   Speech delay     History reviewed. No pertinent surgical history.  OB History   No obstetric history on file.      Home Medications    Prior to Admission medications   Medication Sig Start Date End Date Taking? Authorizing Provider  chlorhexidine (HIBICLENS) 4 % external liquid Cleanse the affected area twice daily while symptoms persist. 05/06/23  Yes Leath-Warren, Sadie Haber, NP  doxycycline (VIBRAMYCIN) 100 MG capsule Take 1 capsule (100 mg total) by mouth 2 (two) times daily for 7 days. 06/01/23 06/08/23 Yes Cathlean Marseilles A, NP  miconazole (MICOTIN) 2 % cream Apply 1 Application topically 2  (two) times daily for 28 days. 06/01/23 06/29/23 Yes Valentino Nose, NP  mupirocin ointment (BACTROBAN) 2 % Apply 1 Application topically 2 (two) times daily. 05/13/23  Yes Particia Nearing, PA-C  acetaminophen (TYLENOL) 500 MG tablet Take 2 tablets (1,000 mg total) by mouth every 6 (six) hours as needed. 03/21/22   Carlisle Beers, FNP  meloxicam (MOBIC) 15 MG tablet Take 1 tablet (15 mg total) by mouth daily. 12/26/21   Donita Brooks, MD  naproxen (NAPROSYN) 500 MG tablet Take 1 tablet (500 mg total) by mouth 2 (two) times daily with a meal. 07/25/22   Eber Hong, MD  ondansetron (ZOFRAN-ODT) 4 MG disintegrating tablet Take 1 tablet (4 mg total) by mouth every 8 (eight) hours as needed for nausea. 07/25/22   Eber Hong, MD  promethazine (PHENERGAN) 12.5 MG tablet Take 1 tablet (12.5 mg total) by mouth every 8 (eight) hours as needed for nausea or vomiting. 10/13/22   Leath-Warren, Sadie Haber, NP  rizatriptan (MAXALT-MLT) 10 MG disintegrating tablet Take by mouth. 04/25/23   [provider]    Family History History reviewed. No pertinent family history.  Social History Social History   Tobacco Use   Smoking status: Never    Passive exposure: Yes   Smokeless tobacco: Never  Vaping Use   Vaping status: Never Used  Substance Use Topics   Alcohol use: Never   Drug use: Never     Allergies   Patient has no known allergies.   Review of Systems Review of Systems Per HPI  Physical Exam Triage Vital Signs ED Triage Vitals  Encounter Vitals Group     BP 06/01/23 1538 139/84     Systolic BP Percentile --      Diastolic BP Percentile --      Pulse Rate 06/01/23 1538 85     Resp 06/01/23 1538 16     Temp 06/01/23 1538 98.3 F (36.8 C)     Temp Source 06/01/23 1538 Oral     SpO2 06/01/23 1538 99 %     Weight --      Height --      Head Circumference --      Peak Flow --      Pain Score 06/01/23 1542 10     Pain Loc --      Pain Education --       Exclude from Growth Chart --    No data found.  Updated Vital Signs BP 139/84 (BP Location: Right Arm)   Pulse 85   Temp 98.3 F (36.8 C) (Oral)   Resp 16   LMP 05/11/2023 (Exact Date)   SpO2 99%   Visual Acuity Right Eye Distance:   Left Eye Distance:   Bilateral Distance:    Right Eye Near:   Left Eye Near:    Bilateral Near:     Physical Exam Vitals and nursing note reviewed.  Constitutional:      General: She is not in acute distress.    Appearance: Normal appearance. She is not toxic-appearing.  HENT:     Mouth/Throat:     Mouth: Mucous membranes are moist.     Pharynx: Oropharynx is clear.  Pulmonary:     Effort: Pulmonary effort is normal. No respiratory distress.  Musculoskeletal:       Feet:     Comments: Inspection: Diffuse swelling and redness noted to left great toe distally; no bruising or obvious deformity Palpation: Left great toe diffusely tender to palpation; there is fluctuance noted to the lateral aspect of the nailbed as well as subungually ; no obvious deformities palpated ROM: Difficult to assess secondary to pain Neurovascular: neurovascularly intact in distal left great toe  Skin:    General: Skin is warm and dry.     Capillary Refill: Capillary refill takes less than 2 seconds.     Coloration: Skin is not jaundiced or pale.     Findings: Rash (circular shaped rash noted to left lower extremity; flaky skin noted to the center of the rash) present. No erythema.  Neurological:     Mental Status: She is alert and oriented to person, place, and time.  Psychiatric:        Behavior: Behavior is cooperative.      UC Treatments / Results  Labs (all labs ordered are listed, but only abnormal results are displayed) Labs Reviewed - No data to display  EKG   Radiology No results found.  Procedures Incision and Drainage  Date/Time: 06/01/2023 5:00 PM  Performed by: Valentino Nose, NP Authorized by: Valentino Nose, NP    Consent:    Consent obtained:  Verbal   Consent given by:  Patient   Risks, benefits, and alternatives were discussed: yes     Risks discussed:  Bleeding, incomplete drainage, pain and infection   Alternatives  discussed:  Alternative treatment Universal protocol:    Procedure explained and questions answered to patient or proxy's satisfaction: yes     Patient identity confirmed:  Verbally with patient and arm band Location:    Type:  Abscess   Location:  Lower extremity   Lower extremity location:  Toe   Toe location:  L big toe Pre-procedure details:    Skin preparation:  Chlorhexidine Sedation:    Sedation type:  None Anesthesia:    Anesthesia method:  Topical application   Topical anesthesia: freeze spray. Procedure type:    Complexity:  Simple Procedure details:    Incision types:  Stab incision   Incision depth:  Dermal   Drainage:  Purulent   Drainage amount:  Moderate   Wound treatment:  Wound left open Post-procedure details:    Procedure completion:  Tolerated well, no immediate complications  (including critical care time)  Medications Ordered in UC Medications - No data to display  Initial Impression / Assessment and Plan / UC Course  I have reviewed the triage vital signs and the nursing notes.  Pertinent labs & imaging results that were available during my care of the patient were reviewed by me and considered in my medical decision making (see chart for details).   Patient is well-appearing, normotensive, afebrile, not tachycardic, not tachypneic, oxygenating well on room air.    1. Paronychia of great toe of left foot I&D as above Start warm soaks, doxycycline Outpatient x-ray ordered as we do not have x-ray tech today to rule out acute fracture of the toe, if fractured would recommend postop shoe Work excuse provided  2. Tinea corporis Treat with miconazole cream twice daily for up to 4 weeks until full resolution then 1 more week Keep covered  until fully resolves  The patient was given the opportunity to ask questions.  All questions answered to their satisfaction.  The patient is in agreement to this plan.    Final Clinical Impressions(s) / UC Diagnoses   Final diagnoses:  Paronychia of great toe of left foot  Tinea corporis     Discharge Instructions      I believe the skin under your left great toe is infected.  I drained a little bit of fluid from the toe today.  Please take the antibiotic as prescribed and start warm salt soaks at home.    I am also concerned that the toe could be broken, please go to [facility] to get the x-ray done. We will contact you tomorrow if it is abnormal.   Regarding the ring worm, start using the miconazole cream I have sent to the pharmacy.  Use this twice daily until full resolution of the rash, then for 1 more week.  Keep the rash covered.  Seek care if the rash does not improve with this treatment.   ED Prescriptions     Medication Sig Dispense Auth. Provider   doxycycline (VIBRAMYCIN) 100 MG capsule Take 1 capsule (100 mg total) by mouth 2 (two) times daily for 7 days. 14 capsule Cathlean Marseilles A, NP   miconazole (MICOTIN) 2 % cream Apply 1 Application topically 2 (two) times daily for 28 days. 141 g Valentino Nose, NP      PDMP not reviewed this encounter.   Valentino Nose, NP 06/01/23 [id number]

## 2023-06-01 NOTE — ED Triage Notes (Signed)
Pt comes for follow up from ring worm to see if it is healing and also pt hit her left big toe a week ago and now having pain with redness and yellow crusting around toe nail with clear drainage. Taking Advil with no relief.

## 2023-06-02 ENCOUNTER — Ambulatory Visit (HOSPITAL_COMMUNITY)
Admission: RE | Admit: 2023-06-02 | Discharge: 2023-06-02 | Disposition: A | Discharge status: Home / Self Care | Payer: Medicaid Other | Source: Ambulatory Visit | Attending: Nurse Practitioner | Admitting: Nurse Practitioner

## 2023-06-02 DIAGNOSIS — R600 Localized edema: Secondary | ICD-10-CM | POA: Diagnosis not present

## 2023-06-02 DIAGNOSIS — M79675 Pain in left toe(s): Secondary | ICD-10-CM | POA: Diagnosis present

## 2023-06-11 ENCOUNTER — Ambulatory Visit
Admission: EM | Admit: 2023-06-11 | Discharge: 2023-06-11 | Disposition: A | Discharge status: Home / Self Care | Payer: Medicaid Other | Attending: Nurse Practitioner | Admitting: Nurse Practitioner

## 2023-06-11 DIAGNOSIS — R519 Headache, unspecified: Secondary | ICD-10-CM | POA: Diagnosis not present

## 2023-06-11 DIAGNOSIS — Z8669 Personal history of other diseases of the nervous system and sense organs: Secondary | ICD-10-CM | POA: Diagnosis not present

## 2023-06-11 MED ORDER — ONDANSETRON 4 MG PO TBDP: 4.0000 mg | ORAL_TABLET | Freq: Three times a day (TID) | ORAL | 0 refills | Status: AC | PRN

## 2023-06-11 MED ORDER — KETOROLAC TROMETHAMINE 30 MG/ML IJ SOLN
30.0000 mg | Freq: Once | INTRAMUSCULAR | Status: AC
  Administered 2023-06-11: 30 mg via INTRAMUSCULAR

## 2023-06-11 MED ORDER — DEXAMETHASONE SODIUM PHOSPHATE 10 MG/ML IJ SOLN
10.0000 mg | INTRAMUSCULAR | Status: AC
  Administered 2023-06-11: 10 mg via INTRAMUSCULAR

## 2023-06-11 NOTE — Discharge Instructions (Addendum)
You have been given injection of Decadron 10 mg and Toradol 30 mg.  If you experience breakthrough pain, you should take over-the-counter Tylenol. Increase fluids and allow for plenty of rest. Recommend using a cool cloth across her forehead and sleeping in a dimly lit room while symptoms persist. Recommend a brat diet to include bananas, rice, applesauce, toast while nausea persist. Go to the emergency department immediately if you experience worsening headache pain, dizziness, lightheadedness, visual changes, or other concerns. If symptoms fail to improve, please follow-up with your primary care physician for further evaluation.  Also discuss possible referral to neurology for recurrent headaches. Follow-up as needed.

## 2023-06-11 NOTE — ED Triage Notes (Signed)
Pt c/o headache and Nausea and vomiting x 2 days

## 2023-06-11 NOTE — ED Provider Notes (Signed)
[facility]    CSN: [id number] Arrival date & time: 06/11/23  1001      History   Chief Complaint No chief complaint on file.   HPI Ruth Snyder is a 22 y.o. female.   The history is provided by the patient.   The patient presents for complaints of headache with nausea and vomiting. She reports symptoms have been present for the past 2 days. Patient reports history of migraine headaches.from previous she denies fever, chills, sore throat, ear pain, chest pain, abdominal pain, diarrhea, or constipation. Patient also complains of photophobia. She states that she normally takes Excedrin migraine which normally helps her symptoms, but states this is not helping her today. She also states that she has been taking Tylenol. Patient states that Phenergan is the only thing that helps her nausea at this time.   Past Medical History:  Diagnosis Date   GERD (gastroesophageal reflux disease)    Migraine    Speech delay     Patient Active Problem List   Diagnosis Date Noted   Seasonal allergies 04/03/2023   Childhood obesity, BMI 95-100 percentile 10/02/2020   History of headache 04/27/2020   Wears glasses 04/27/2020   Migraine    Viral syndrome 02/23/2013   Dermatitis 02/23/2013   Speech delay     History reviewed. No pertinent surgical history.  OB History   No obstetric history on file.      Home Medications    Prior to Admission medications   Medication Sig Start Date End Date Taking? Authorizing Provider  ondansetron (ZOFRAN-ODT) 4 MG disintegrating tablet Take 1 tablet (4 mg total) by mouth every 8 (eight) hours as needed. 06/11/23  Yes Chaske Paskett-Warren, Sadie Haber, NP  acetaminophen (TYLENOL) 500 MG tablet Take 2 tablets (1,000 mg total) by mouth every 6 (six) hours as needed. 03/21/22   Carlisle Beers, FNP  chlorhexidine (HIBICLENS) 4 % external liquid Cleanse the affected area twice daily while symptoms persist. 05/06/23   Tomiko Schoon-Warren, Sadie Haber,  NP  meloxicam (MOBIC) 15 MG tablet Take 1 tablet (15 mg total) by mouth daily. 12/26/21   Donita Brooks, MD  miconazole (MICOTIN) 2 % cream Apply 1 Application topically 2 (two) times daily for 28 days. 06/01/23 06/29/23  Valentino Nose, NP  mupirocin ointment (BACTROBAN) 2 % Apply 1 Application topically 2 (two) times daily. 05/13/23   Particia Nearing, PA-C  naproxen (NAPROSYN) 500 MG tablet Take 1 tablet (500 mg total) by mouth 2 (two) times daily with a meal. 07/25/22   Eber Hong, MD  promethazine (PHENERGAN) 12.5 MG tablet Take 1 tablet (12.5 mg total) by mouth every 8 (eight) hours as needed for nausea or vomiting. 10/13/22   Makeba Delcastillo-Warren, Sadie Haber, NP  rizatriptan (MAXALT-MLT) 10 MG disintegrating tablet Take by mouth. 04/25/23   [provider]    Family History History reviewed. No pertinent family history.  Social History Social History   Tobacco Use   Smoking status: Never    Passive exposure: Yes   Smokeless tobacco: Never  Vaping Use   Vaping status: Never Used  Substance Use Topics   Alcohol use: Never   Drug use: Never     Allergies   Patient has no known allergies.   Review of Systems Review of Systems Per HPI  Physical Exam Triage Vital Signs ED Triage Vitals  Encounter Vitals Group     BP 06/11/23 1008 122/83     Systolic BP Percentile --  Diastolic BP Percentile --      Pulse Rate 06/11/23 1008 92     Resp 06/11/23 1008 18     Temp 06/11/23 1008 98.2 F (36.8 C)     Temp Source 06/11/23 1008 Oral     SpO2 06/11/23 1008 98 %     Weight --      Height --      Head Circumference --      Peak Flow --      Pain Score 06/11/23 1011 8     Pain Loc --      Pain Education --      Exclude from Growth Chart --    No data found.  Updated Vital Signs BP 122/83 (BP Location: Right Arm)   Pulse 92   Temp 98.2 F (36.8 C) (Oral)   Resp 18   LMP 06/10/2023 (Exact Date)   SpO2 98%   Visual Acuity Right Eye Distance:    Left Eye Distance:   Bilateral Distance:    Right Eye Near:   Left Eye Near:    Bilateral Near:     Physical Exam Vitals and nursing note reviewed.  Constitutional:      General: She is not in acute distress.    Appearance: Normal appearance.  HENT:     Head: Normocephalic.     Right Ear: Tympanic membrane, ear canal and external ear normal.     Nose: Nose normal.     Mouth/Throat:     Mouth: Mucous membranes are moist.  Eyes:     Extraocular Movements: Extraocular movements intact.     Conjunctiva/sclera: Conjunctivae normal.     Pupils: Pupils are equal, round, and reactive to light.  Cardiovascular:     Rate and Rhythm: Normal rate and regular rhythm.     Pulses: Normal pulses.     Heart sounds: Normal heart sounds.  Pulmonary:     Effort: Pulmonary effort is normal. No respiratory distress.     Breath sounds: Normal breath sounds. No stridor. No wheezing, rhonchi or rales.  Abdominal:     General: Bowel sounds are normal.     Palpations: Abdomen is soft.     Tenderness: There is no abdominal tenderness.  Musculoskeletal:     Cervical back: Normal range of motion.  Lymphadenopathy:     Cervical: No cervical adenopathy.  Skin:    General: Skin is warm and dry.  Neurological:     General: No focal deficit present.     Mental Status: She is alert and oriented to person, place, and time.     GCS: GCS eye subscore is 4. GCS verbal subscore is 5. GCS motor subscore is 6.     Cranial Nerves: No cranial nerve deficit.     Sensory: No sensory deficit.     Motor: No weakness.     Coordination: Coordination normal.     Gait: Gait is intact. Gait normal.     Deep Tendon Reflexes: Reflexes normal.  Psychiatric:        Mood and Affect: Mood normal.        Behavior: Behavior normal.      UC Treatments / Results  Labs (all labs ordered are listed, but only abnormal results are displayed) Labs Reviewed - No data to display  EKG   Radiology No results  found.  Procedures Procedures (including critical care time)  Medications Ordered in UC Medications  dexamethasone (DECADRON) injection 10 mg (has no administration in  time range)  ketorolac (TORADOL) 30 MG/ML injection 30 mg (has no administration in time range)    Initial Impression / Assessment and Plan / UC Course  I have reviewed the triage vital signs and the nursing notes.  Pertinent labs & imaging results that were available during my care of the patient were reviewed by me and considered in my medical decision making (see chart for details).  Suspect a migraine headache.. She was administered Toradol 30 mg IM and Decadron 10 mg IM.  Zofran 4 mg ODT prescribed for nausea. Supportive care recommendations were provided to the patient to include use of Pedialyte or Gatorade lites to help with rehydration, recommend a brat diet, and continuing Tylenol or Excedrin Migraine for her headache pain. Patient was given strict ER precautions. Patient verbalizes understanding. All questions were answered. Patient stable for discharge.  Work note was provided.  Final Clinical Impressions(s) / UC Diagnoses   Final diagnoses:  Nonintractable headache, unspecified chronicity pattern, unspecified headache type     Discharge Instructions      You have been given injection of Decadron 10 mg and Toradol 30 mg.  If you experience breakthrough pain, you should take over-the-counter Tylenol. Increase fluids and allow for plenty of rest. Recommend using a cool cloth across her forehead and sleeping in a dimly lit room while symptoms persist. Recommend a brat diet to include bananas, rice, applesauce, toast while nausea persist. Go to the emergency department immediately if you experience worsening headache pain, dizziness, lightheadedness, visual changes, or other concerns. If symptoms fail to improve, please follow-up with your primary care physician for further evaluation.  Also discuss possible  referral to neurology for recurrent headaches. Follow-up as needed.     ED Prescriptions     Medication Sig Dispense Auth. Provider   ondansetron (ZOFRAN-ODT) 4 MG disintegrating tablet Take 1 tablet (4 mg total) by mouth every 8 (eight) hours as needed. 20 tablet Delainey Winstanley-Warren, Sadie Haber, NP      PDMP not reviewed this encounter.   Abran Cantor, NP 06/11/23 1421

## 2023-06-22 ENCOUNTER — Ambulatory Visit: Payer: Medicaid Other | Admitting: Family Medicine

## 2023-06-22 ENCOUNTER — Encounter: Payer: Self-pay | Admitting: Family Medicine

## 2023-06-22 VITALS — BP 132/86 | HR 75 | Temp 98.7°F | Ht 64.0 in | Wt 188.4 lb

## 2023-06-22 DIAGNOSIS — F321 Major depressive disorder, single episode, moderate: Secondary | ICD-10-CM

## 2023-06-22 MED ORDER — ESCITALOPRAM OXALATE 10 MG PO TABS: 10.0000 mg | ORAL_TABLET | Freq: Every day | ORAL | 3 refills | Status: AC

## 2023-06-22 MED ORDER — RIZATRIPTAN BENZOATE 10 MG PO TBDP: 10.0000 mg | ORAL_TABLET | ORAL | 2 refills | Status: AC | PRN

## 2023-06-22 NOTE — Progress Notes (Signed)
Subjective:    Patient ID: Ruth Snyder, female    DOB: [birth year], 22 y.o.   MRN: [id number]  HPI Patient is here today with her mother.  They state that she has been dealing with depression for months.  However recently, the depression has worsened.  Her uncle and his girlfriend overdosed by accident on fentanyl.  Their death was a tremendous shock for the patient.  Her boyfriend also recently broke up with her.  She states that this was not the precipitating event however it did make the situation worse.  She does report suicidal thoughts.  She is adamant that she would not act on them.  She does not have a plan.  She does not have any access to weapons.  She thinks that sometimes she will be better off if she were not here.  She denies any homicidal ideation.  She denies any mania.  She denies any delusions.  She reports low energy.  She reports trouble with concentration.  She reports trouble sleeping.  She reports poor appetite.  She is requesting a referral for therapy  Past Medical History:  Diagnosis Date   GERD (gastroesophageal reflux disease)    Migraine    Speech delay    No past surgical history on file. Current Outpatient Medications on File Prior to Visit  Medication Sig Dispense Refill   acetaminophen (TYLENOL) 500 MG tablet Take 2 tablets (1,000 mg total) by mouth every 6 (six) hours as needed. 30 tablet 0   chlorhexidine (HIBICLENS) 4 % external liquid Cleanse the affected area twice daily while symptoms persist. 118 mL 0   meloxicam (MOBIC) 15 MG tablet Take 1 tablet (15 mg total) by mouth daily. 30 tablet 0   miconazole (MICOTIN) 2 % cream Apply 1 Application topically 2 (two) times daily for 28 days. 141 g 0   mupirocin ointment (BACTROBAN) 2 % Apply 1 Application topically 2 (two) times daily. 80 g 1   naproxen (NAPROSYN) 500 MG tablet Take 1 tablet (500 mg total) by mouth 2 (two) times daily with a meal. 30 tablet 0   ondansetron (ZOFRAN-ODT) 4 MG disintegrating  tablet Take 1 tablet (4 mg total) by mouth every 8 (eight) hours as needed. 20 tablet 0   promethazine (PHENERGAN) 12.5 MG tablet Take 1 tablet (12.5 mg total) by mouth every 8 (eight) hours as needed for nausea or vomiting. 6 tablet 0   rizatriptan (MAXALT-MLT) 10 MG disintegrating tablet Take by mouth.     No current facility-administered medications on file prior to visit.   No Known Allergies  Social History   Socioeconomic History   Marital status: Single    Spouse name: Not on file   Number of children: Not on file   Years of education: Not on file   Highest education level: Not on file  Occupational History   Not on file  Tobacco Use   Smoking status: Never    Passive exposure: Yes   Smokeless tobacco: Never  Vaping Use   Vaping status: Never Used  Substance and Sexual Activity   Alcohol use: Never   Drug use: Never   Sexual activity: Never    Birth control/protection: None  Other Topics Concern   Not on file  Social History Narrative   ** Merged History Encounter **       Social Determinants of Health   Financial Resource Strain: Not on file  Food Insecurity: Not on file  Transportation Needs: Not on file  Physical Activity: Not on file  Stress: Not on file  Social Connections: Not on file  Intimate Partner Violence: Not on file      Review of Systems  All other systems reviewed and are negative.      Objective:   Physical Exam Constitutional:      Appearance: Normal appearance. She is not ill-appearing or toxic-appearing.  HENT:     Right Ear: Tympanic membrane and ear canal normal.     Left Ear: Tympanic membrane and ear canal normal.     Mouth/Throat:     Pharynx: No oropharyngeal exudate.  Eyes:     Conjunctiva/sclera: Conjunctivae normal.  Cardiovascular:     Rate and Rhythm: Normal rate and regular rhythm.     Heart sounds: Normal heart sounds. No murmur heard. Pulmonary:     Effort: Pulmonary effort is normal. No respiratory distress.      Breath sounds: Normal breath sounds. No wheezing, rhonchi or rales.  Abdominal:     General: Abdomen is flat. There is no distension.     Tenderness: There is no rebound.  Musculoskeletal:     Cervical back: Neck supple.  Neurological:     Mental Status: She is alert.           Assessment & Plan:  Current moderate episode of major depressive disorder without prior episode (HCC) - Plan: Ambulatory referral to Psychology I will place a referral for counseling.  I [facility] because I believe they will accept her insurance.  I also gave the patient and her mother the contact information so they can go ahead and reach out to try to make an appointment with [facility] for counseling.  Meanwhile I would like to start the patient on Lexapro 10 mg a day and reassess the patient in 4 weeks.  I asked her to call me immediately if symptoms worsen or to seek medical attention immediately if symptoms worsen.

## 2023-07-20 ENCOUNTER — Ambulatory Visit: Payer: Medicaid Other | Admitting: Family Medicine
